# Patient Record
Sex: Female | Born: 1975 | Race: White | Hispanic: No | Marital: Single | Smoking: Never smoker
Health system: Southern US, Community
[De-identification: ages and names within clinical notes are randomized; demographics above are authoritative.]

## PROBLEM LIST (undated history)

## (undated) DIAGNOSIS — N390 Urinary tract infection, site not specified: Secondary | ICD-10-CM

## (undated) DIAGNOSIS — N83209 Unspecified ovarian cyst, unspecified side: Secondary | ICD-10-CM

## (undated) HISTORY — PX: CHOLECYSTECTOMY: SHX55

---

## 2016-11-10 DIAGNOSIS — L2089 Other atopic dermatitis: Secondary | ICD-10-CM | POA: Diagnosis not present

## 2016-11-19 ENCOUNTER — Emergency Department (HOSPITAL_COMMUNITY)
Admission: EM | Admit: 2016-11-19 | Discharge: 2016-11-19 | Disposition: A | Discharge status: Home / Self Care | Payer: Medicaid Other | Attending: Emergency Medicine | Admitting: Emergency Medicine

## 2016-11-19 ENCOUNTER — Encounter (HOSPITAL_COMMUNITY): Payer: Self-pay | Admitting: Emergency Medicine

## 2016-11-19 DIAGNOSIS — R21 Rash and other nonspecific skin eruption: Secondary | ICD-10-CM | POA: Diagnosis present

## 2016-11-19 DIAGNOSIS — L237 Allergic contact dermatitis due to plants, except food: Secondary | ICD-10-CM | POA: Diagnosis not present

## 2016-11-19 MED ORDER — PREDNISONE 20 MG PO TABS: ORAL_TABLET | ORAL | 0 refills | Status: DC

## 2016-11-19 MED ORDER — TETANUS-DIPHTH-ACELL PERTUSSIS 5-2.5-18.5 LF-MCG/0.5 IM SUSP
0.5000 mL | Freq: Once | INTRAMUSCULAR | Status: AC
  Administered 2016-11-19: 0.5 mL via INTRAMUSCULAR
  Filled 2016-11-19: qty 0.5

## 2016-11-19 NOTE — Discharge Instructions (Signed)
You can take Benadryl 50 mg every 6 hours as needed for itch. Take the prednisone as prescribed. Call Dr. Terri PiedraLupton to schedule appointment if not improving in 4 or 5 days. You can also call the number on these discharge instructions to get a primary care physician

## 2016-11-19 NOTE — ED Triage Notes (Signed)
Pt to ER for rash present to bilateral lower extremities, left forearm, right forearm, left flank. States was seen by PCP and given prednisone for poison ivy but has taken the course of steroids without any relief. States itches, burns, and is painful. States began as blisters.

## 2016-11-19 NOTE — ED Provider Notes (Addendum)
[facility] Provider Note   CSN: [id number] Arrival date & time: 11/19/16  0754     History   Chief Complaint Chief Complaint  Patient presents with  . Rash    HPI Pamela Hawkins is a 41 y.o. female.Complains of pruritic rash on arms, legs and lower abdomen onset 3 weeks ago. She noticed that after she walked out towards. She denies any shortness of breath denies fever denies feeling ill. She [place] physician [place] some small town" on her way to [place] from [place] who prescribed prednisone for one week which she has completed and she took one dose of Benadryl approximately 2 weeks ago, without relief. She's also used some sort of topical "salve" which she does not recall, without relief. No other associated symptoms nothing makes symptoms better or worse. She reports completing the prednisone "2 or 3 days ago."  HPI  History reviewed. No pertinent past medical history.  There are no active problems to display for this patient.   History reviewed. No pertinent surgical history.  OB History    No data available       Home Medications    Prior to Admission medications   Medication Sig Start Date End Date Taking? Authorizing Provider  predniSONE (DELTASONE) 20 MG tablet 2 tablets daily for 7 days 11/19/16   Doug Sou, MD    Family History History reviewed. No pertinent family history.  Social History Social History  Substance Use Topics  . Smoking status: Never Smoker  . Smokeless tobacco: Never Used  . Alcohol use No     Allergies   Patient has no allergy information on record.   Review of Systems Review of Systems  Constitutional: Negative.   HENT: Negative.   Respiratory: Negative.   Cardiovascular: Negative.   Gastrointestinal: Negative.   Musculoskeletal: Negative.   Skin: Positive for rash.  Allergic/Immunologic:       Not current on tetanus immunization  Neurological: Negative.   Psychiatric/Behavioral: Negative.   All other  systems reviewed and are negative.    Physical Exam Updated Vital Signs BP (!) 128/92 (BP Location: Left Arm)   Pulse 96   Temp 97.9 F (36.6 C) (Oral)   Resp 18   LMP 10/31/2016 (Exact Date)   SpO2 97%   Physical Exam  Constitutional: She appears well-developed and well-nourished. No distress.  HENT:  Head: Normocephalic and atraumatic.  Poor dentition no mucosal lesion  Eyes: Pupils are equal, round, and reactive to light. Conjunctivae are normal.  Neck: Neck supple. No tracheal deviation present. No thyromegaly present.  Cardiovascular: Normal rate and regular rhythm.   No murmur heard. Pulmonary/Chest: Effort normal and breath sounds normal.  Abdominal: Soft. Bowel sounds are normal. She exhibits no distension. There is no tenderness.  Musculoskeletal: Normal range of motion. She exhibits no edema or tenderness.  Neurological: She is alert. Coordination normal.  Skin: Skin is warm and dry. Rash noted.  Pinkish rash in streaks with a few tiny scabbed lesions predominantly on arms and legs slightly on lower abdomen, not on back or face. Not involving palms or soles.  Psychiatric: She has a normal mood and affect.  Nursing note and vitals reviewed.    ED Treatments / Results  Labs (all labs ordered are listed, but only abnormal results are displayed) Labs Reviewed - No data to display  EKG  EKG Interpretation None       Radiology No results found.  Procedures Procedures (including critical care time)  Medications Ordered  in ED Medications  Tdap (BOOSTRIX) injection 0.5 mL (not administered)     Initial Impression / Assessment and Plan / ED Course  I have reviewed the triage vital signs and the nursing notes.  Pertinent labs & imaging results that were available during my care of the patient were reviewed by me and considered in my medical decision making (see chart for details).     Rashes consistent with poison ivy or poison oak. Plan she get  prescription for 1 week of prednisone, Benadryl. Referral to dermatology Dr. Terri PiedraLupton. Referral primary care. Tdap updated. Recommend Aveeno baths  Final Clinical Impressions(s) / ED Diagnoses   Final diagnoses:  Allergic contact dermatitis due to plants, except food    New Prescriptions New Prescriptions   PREDNISONE (DELTASONE) 20 MG TABLET    2 tablets daily for 7 days     [name], [name], MD 11/19/16 [id number]    [name], [name], MD 11/19/16 1750

## 2017-01-04 ENCOUNTER — Encounter (HOSPITAL_COMMUNITY): Payer: Self-pay | Admitting: Emergency Medicine

## 2017-01-04 ENCOUNTER — Ambulatory Visit (INDEPENDENT_AMBULATORY_CARE_PROVIDER_SITE_OTHER): Payer: Medicaid Other

## 2017-01-04 ENCOUNTER — Ambulatory Visit (HOSPITAL_COMMUNITY)
Admission: EM | Admit: 2017-01-04 | Discharge: 2017-01-04 | Disposition: A | Discharge status: Home / Self Care | Payer: Medicaid Other | Attending: Family Medicine | Admitting: Family Medicine

## 2017-01-04 DIAGNOSIS — K5901 Slow transit constipation: Secondary | ICD-10-CM | POA: Diagnosis not present

## 2017-01-04 DIAGNOSIS — Z880 Allergy status to penicillin: Secondary | ICD-10-CM | POA: Diagnosis not present

## 2017-01-04 DIAGNOSIS — R350 Frequency of micturition: Secondary | ICD-10-CM

## 2017-01-04 DIAGNOSIS — R300 Dysuria: Secondary | ICD-10-CM

## 2017-01-04 DIAGNOSIS — Z3202 Encounter for pregnancy test, result negative: Secondary | ICD-10-CM | POA: Diagnosis not present

## 2017-01-04 DIAGNOSIS — N83209 Unspecified ovarian cyst, unspecified side: Secondary | ICD-10-CM | POA: Insufficient documentation

## 2017-01-04 DIAGNOSIS — R141 Gas pain: Secondary | ICD-10-CM

## 2017-01-04 DIAGNOSIS — Z9049 Acquired absence of other specified parts of digestive tract: Secondary | ICD-10-CM | POA: Diagnosis not present

## 2017-01-04 DIAGNOSIS — Z87440 Personal history of urinary (tract) infections: Secondary | ICD-10-CM | POA: Insufficient documentation

## 2017-01-04 DIAGNOSIS — R1030 Lower abdominal pain, unspecified: Secondary | ICD-10-CM | POA: Diagnosis present

## 2017-01-04 DIAGNOSIS — N390 Urinary tract infection, site not specified: Secondary | ICD-10-CM | POA: Diagnosis present

## 2017-01-04 HISTORY — DX: Unspecified ovarian cyst, unspecified side: N83.209

## 2017-01-04 HISTORY — DX: Urinary tract infection, site not specified: N39.0

## 2017-01-04 LAB — POCT URINALYSIS DIP (DEVICE)
BILIRUBIN URINE: NEGATIVE
Glucose, UA: NEGATIVE mg/dL
HGB URINE DIPSTICK: NEGATIVE
KETONES UR: NEGATIVE mg/dL
Leukocytes, UA: NEGATIVE
NITRITE: NEGATIVE
PROTEIN: NEGATIVE mg/dL
Specific Gravity, Urine: 1.02 (ref 1.005–1.030)
Urobilinogen, UA: 0.2 mg/dL (ref 0.0–1.0)
pH: 5 (ref 5.0–8.0)

## 2017-01-04 LAB — POCT PREGNANCY, URINE: Preg Test, Ur: NEGATIVE

## 2017-01-04 MED ORDER — CIPROFLOXACIN HCL 500 MG PO TABS: 500.0000 mg | ORAL_TABLET | Freq: Two times a day (BID) | ORAL | 0 refills | Status: DC

## 2017-01-04 NOTE — ED Triage Notes (Signed)
History of uti.  Last uti was 3 months ago.  Patient says current symptoms started 3 days ago.  Low abdominal pain, back pain, burning with urination and frequent urination

## 2017-01-04 NOTE — Discharge Instructions (Signed)
Drink plenty of water and stay well-hydrated. Take the MiraLAX as directed, start out with 3 glasses over. When our two-hour and a half. If results are not sufficient and 6 hours repeat two glasses. Take the antibiotic as directed. It is uncertain as to whether he may have an early urinary tract infection, the urine is clear today. For worsening, new symptoms or problems, vomiting, fever then you may need to go to the emergency department.

## 2017-01-04 NOTE — ED Provider Notes (Signed)
[facility]    CSN: [id number] Arrival date & time: 01/04/17  1026     History   Chief Complaint Chief Complaint  Patient presents with  . Urinary Tract Infection    HPI Pamela Hawkins is a 41 y.o. female.   41 year old female complaining of suprapubic pain for 3 days. She states she feels sick in the stomach but not nausea or vomiting. Also has occasional dysuria, urinary frequency primarily at night. States that she goes about 6 times a night. And only a 2 times more a day.. When she voids she reports large amounts of urine but occasionally just dribbles. Denies increase in thirst. She points to the suprapubic area as the source of pain until she is supine then she states she has had discomfort across the lower abdomen. States has regular daily bowel movements. No diarrhea.      Past Medical History:  Diagnosis Date  . Ovarian cyst   . UTI (urinary tract infection)     There are no active problems to display for this patient.   Past Surgical History:  Procedure Laterality Date  . CHOLECYSTECTOMY      OB History    No data available       Home Medications    Prior to Admission medications   Medication Sig Start Date End Date Taking? Authorizing Provider  acetaminophen (TYLENOL) 325 MG tablet Take 650 mg by mouth every 6 (six) hours as needed.   Yes [provider]  ibuprofen (ADVIL,MOTRIN) 200 MG tablet Take 200 mg by mouth every 6 (six) hours as needed.   Yes [provider]  ciprofloxacin (CIPRO) 500 MG tablet Take 1 tablet (500 mg total) by mouth 2 (two) times daily. 01/04/17   Hayden Rasmussen, NP    Family History No family history on file.  Social History Social History  Substance Use Topics  . Smoking status: Never Smoker  . Smokeless tobacco: Never Used  . Alcohol use No     Allergies   Codeine and Penicillins   Review of Systems Review of Systems  Constitutional: Negative.   HENT: Negative.   Respiratory:  Negative.   Cardiovascular: Negative for chest pain and palpitations.  Gastrointestinal: Positive for abdominal pain. Negative for abdominal distention, constipation, diarrhea, nausea and vomiting.  Genitourinary: Positive for dysuria and frequency. Negative for pelvic pain, vaginal bleeding and vaginal discharge.       Mid Suprapubic pain  Musculoskeletal: Negative.   Neurological: Negative.   All other systems reviewed and are negative.    Physical Exam Triage Vital Signs ED Triage Vitals  Enc Vitals Group     BP 01/04/17 1147 109/71     Pulse Rate 01/04/17 1147 76     Resp 01/04/17 1147 18     Temp 01/04/17 1147 97.7 F (36.5 C)     Temp Source 01/04/17 1147 Oral     SpO2 01/04/17 1147 98 %     Weight --      Height --      Head Circumference --      Peak Flow --      Pain Score 01/04/17 1144 5     Pain Loc --      Pain Edu? --      Excl. in GC? --    No data found.   Updated Vital Signs BP 109/71 (BP Location: Right Arm) Comment (BP Location): large cuff  Pulse 76   Temp 97.7 F (36.5 C) (Oral)  Resp 18   LMP 12/22/2016 (Exact Date)   SpO2 98%   Visual Acuity Right Eye Distance:   Left Eye Distance:   Bilateral Distance:    Right Eye Near:   Left Eye Near:    Bilateral Near:     Physical Exam  Constitutional: She is oriented to person, place, and time. She appears well-developed and well-nourished.  Neck: Neck supple.  Cardiovascular: Normal rate, regular rhythm and normal heart sounds.   Pulmonary/Chest: Effort normal.  Abdominal: Soft. Bowel sounds are normal. There is tenderness. There is no rebound.  Generalized abdominal tenderness, left lower quadrant with the greatest amount of tenderness. Abdomen is soft. Some guarding with deep palpation in the lower quadrants. Tenderness to the mid suprapubic area.  Musculoskeletal: Normal range of motion. She exhibits no edema.  Neurological: She is alert and oriented to person, place, and time.  Skin: Skin  is warm and dry.  Psychiatric: She has a normal mood and affect.  Nursing note and vitals reviewed.    UC Treatments / Results  Labs (all labs ordered are listed, but only abnormal results are displayed) Labs Reviewed  URINE CULTURE  POCT URINALYSIS DIP (DEVICE)  POCT PREGNANCY, URINE    EKG  EKG Interpretation None       Radiology Dg Abd 1 View  Result Date: 01/04/2017 CLINICAL DATA:  Bladder pain EXAM: ABDOMEN - 1 VIEW COMPARISON:  None. FINDINGS: No gaseous small bowel dilatation. Moderate stool volume noted right colon. Surgical clips right upper quadrant suggest prior cholecystectomy. Phleboliths are seen over the lower anatomic pelvis bilaterally. Visualized bony anatomy is unremarkable. IMPRESSION: Nonspecific bowel gas pattern. Electronically Signed   By: Kennith Center M.D.   On: 01/04/2017 12:34    Procedures Procedures (including critical care time)  Medications Ordered in UC Medications - No data to display   Initial Impression / Assessment and Plan / UC Course  I have reviewed the triage vital signs and the nursing notes.  Pertinent labs & imaging results that were available during my care of the patient were reviewed by me and considered in my medical decision making (see chart for details).    Drink plenty of water and stay well-hydrated. Take the MiraLAX as directed, start out with 3 glasses over. When our two-hour and a half. If results are not sufficient and 6 hours repeat two glasses. Take the antibiotic as directed. It is uncertain as to whether he may have an early urinary tract infection, the urine is clear today. For worsening, new symptoms or problems, vomiting, fever then you may need to go to the emergency department.     Final Clinical Impressions(s) / UC Diagnoses   Final diagnoses:  Abdominal gas pain  Slow transit constipation  Dysuria  Urinary frequency    New Prescriptions New Prescriptions   CIPROFLOXACIN (CIPRO) 500 MG TABLET     Take 1 tablet (500 mg total) by mouth 2 (two) times daily.     Controlled Substance Prescriptions [place] Controlled Substance Registry consulted? Not Applicable   Hayden Rasmussen, NP 01/04/17 1401

## 2017-01-06 LAB — URINE CULTURE: SPECIAL REQUESTS: NORMAL

## 2017-05-22 DIAGNOSIS — R109 Unspecified abdominal pain: Secondary | ICD-10-CM | POA: Diagnosis not present

## 2017-08-16 ENCOUNTER — Ambulatory Visit (HOSPITAL_COMMUNITY)
Admission: EM | Admit: 2017-08-16 | Discharge: 2017-08-16 | Disposition: A | Discharge status: Home / Self Care | Payer: Medicaid Other | Attending: Family Medicine | Admitting: Family Medicine

## 2017-08-16 ENCOUNTER — Encounter (HOSPITAL_COMMUNITY): Payer: Self-pay | Admitting: Emergency Medicine

## 2017-08-16 ENCOUNTER — Ambulatory Visit (INDEPENDENT_AMBULATORY_CARE_PROVIDER_SITE_OTHER): Payer: Medicaid Other

## 2017-08-16 DIAGNOSIS — R05 Cough: Secondary | ICD-10-CM

## 2017-08-16 DIAGNOSIS — R062 Wheezing: Secondary | ICD-10-CM

## 2017-08-16 DIAGNOSIS — R059 Cough, unspecified: Secondary | ICD-10-CM

## 2017-08-16 MED ORDER — AZITHROMYCIN 250 MG PO TABS: 250.0000 mg | ORAL_TABLET | Freq: Every day | ORAL | 0 refills | Status: DC

## 2017-08-16 MED ORDER — PREDNISONE 10 MG (21) PO TBPK: ORAL_TABLET | Freq: Every day | ORAL | 0 refills | Status: DC

## 2017-08-16 MED ORDER — ALBUTEROL SULFATE HFA 108 (90 BASE) MCG/ACT IN AERS: 2.0000 | INHALATION_SPRAY | RESPIRATORY_TRACT | 2 refills | Status: DC | PRN

## 2017-08-16 NOTE — ED Triage Notes (Signed)
Pt sts cough and URI sx with pain with cough; pt sts out of her inhaler

## 2017-08-23 NOTE — ED Provider Notes (Signed)
[facility]   [id number] 08/16/17 Arrival Time: 1725  ASSESSMENT & PLAN:  1. Cough   2. Wheezing     Meds ordered this encounter  Medications  . predniSONE (STERAPRED UNI-PAK 21 TAB) 10 MG (21) TBPK tablet    Sig: Take by mouth daily. Take as directed.    Dispense:  21 tablet    Refill:  0  . azithromycin (ZITHROMAX) 250 MG tablet    Sig: Take 1 tablet (250 mg total) by mouth daily. Take first 2 tablets together, then 1 every day until finished.    Dispense:  6 tablet    Refill:  0  . albuterol (PROVENTIL HFA;VENTOLIN HFA) 108 (90 Base) MCG/ACT inhaler    Sig: Inhale 2 puffs into the lungs every 4 (four) hours as needed for wheezing or shortness of breath.    Dispense:  1 Inhaler    Refill:  2   Discussed typical duration of symptoms. OTC symptom care as needed. Ensure adequate fluid intake and rest. May f/u with PCP or here as needed.  Reviewed expectations re: course of current medical issues. Questions answered. Outlined signs and symptoms indicating need for more acute intervention. Patient verbalized understanding. After Visit Summary given.   SUBJECTIVE: History from: patient.  Pamela Hawkins is a 42 y.o. female who presents with complaint of nasal congestion, post-nasal drainage, and a persistent dry cough. Onset abrupt, approximately 1 week ago, maybe longer. Overall fatigued with body aches. SOB: none. Wheezing: moderate, esp with coughing. Out of her albuterol inhaler. No specific aggravating or alleviating factors reported. Fever: no. Overall normal PO intake without n/v. Sick contacts: no. OTC treatment: cold medications without relief.   Social History   Tobacco Use  Smoking Status Never Smoker  Smokeless Tobacco Never Used    ROS: As per HPI.   OBJECTIVE:  Vitals:   08/16/17 1747  BP: 130/87  Pulse: (!) 105  Resp: 18  Temp: 99.2 F (37.3 C)  TempSrc: Oral  SpO2: 95%    Slight tachycardia noted.  General appearance: alert;  appears fatigued HEENT: nasal congestion; clear runny nose; throat irritation secondary to post-nasal drainage Neck: supple without LAD CV: regular Lungs: unlabored respirations, symmetrical air entry with expiratory wheezes; cough: mild; no respiratory distress Skin: warm and dry Ext: no edema Psychological: alert and cooperative; normal mood and affect  Imaging: Dg Chest 2 View  Result Date: 08/16/2017 CLINICAL DATA:  Cough and fever. EXAM: CHEST - 2 VIEW COMPARISON:  None. FINDINGS: The heart size and mediastinal contours are within normal limits. Both lungs are clear. The visualized skeletal structures are unremarkable. IMPRESSION: No active cardiopulmonary disease. Electronically Signed   By: Deatra Robinson M.D.   On: 08/16/2017 18:31    Allergies  Allergen Reactions  . Codeine   . Penicillins     Past Medical History:  Diagnosis Date  . Ovarian cyst   . UTI (urinary tract infection)    History reviewed. No pertinent family history. Social History   Socioeconomic History  . Marital status: Single    Spouse name: Not on file  . Number of children: Not on file  . Years of education: Not on file  . Highest education level: Not on file  Occupational History  . Not on file  Social Needs  . Financial resource strain: Not on file  . Food insecurity:    Worry: Not on file    Inability: Not on file  . Transportation needs:    Medical:  Not on file    Non-medical: Not on file  Tobacco Use  . Smoking status: Never Smoker  . Smokeless tobacco: Never Used  Substance and Sexual Activity  . Alcohol use: No  . Drug use: Not on file  . Sexual activity: Never  Lifestyle  . Physical activity:    Days per week: Not on file    Minutes per session: Not on file  . Stress: Not on file  Relationships  . Social connections:    Talks on phone: Not on file    Gets together: Not on file    Attends religious service: Not on file    Active member of club or organization: Not on file      Attends meetings of clubs or organizations: Not on file    Relationship status: Not on file  . Intimate partner violence:    Fear of current or ex partner: Not on file    Emotionally abused: Not on file    Physically abused: Not on file    Forced sexual activity: Not on file  Other Topics Concern  . Not on file  Social History Narrative  . Not on file           Mardella Layman, MD 08/23/17 [phone]

## 2017-10-06 DIAGNOSIS — H5213 Myopia, bilateral: Secondary | ICD-10-CM | POA: Diagnosis not present

## 2017-10-09 DIAGNOSIS — H5213 Myopia, bilateral: Secondary | ICD-10-CM | POA: Diagnosis not present

## 2017-11-09 DIAGNOSIS — H524 Presbyopia: Secondary | ICD-10-CM | POA: Diagnosis not present

## 2017-12-18 ENCOUNTER — Ambulatory Visit (HOSPITAL_COMMUNITY)
Admission: EM | Admit: 2017-12-18 | Discharge: 2017-12-18 | Disposition: A | Discharge status: Home / Self Care | Payer: Medicaid Other | Attending: Family Medicine | Admitting: Family Medicine

## 2017-12-18 ENCOUNTER — Encounter (HOSPITAL_COMMUNITY): Payer: Self-pay | Admitting: Emergency Medicine

## 2017-12-18 ENCOUNTER — Other Ambulatory Visit: Payer: Self-pay

## 2017-12-18 DIAGNOSIS — T148XXA Other injury of unspecified body region, initial encounter: Secondary | ICD-10-CM | POA: Diagnosis not present

## 2017-12-18 DIAGNOSIS — M79604 Pain in right leg: Secondary | ICD-10-CM

## 2017-12-18 DIAGNOSIS — M25511 Pain in right shoulder: Secondary | ICD-10-CM

## 2017-12-18 DIAGNOSIS — S39012A Strain of muscle, fascia and tendon of lower back, initial encounter: Secondary | ICD-10-CM

## 2017-12-18 MED ORDER — IBUPROFEN 800 MG PO TABS: 800.0000 mg | ORAL_TABLET | Freq: Three times a day (TID) | ORAL | 0 refills | Status: DC | PRN

## 2017-12-18 MED ORDER — KETOROLAC TROMETHAMINE 60 MG/2ML IM SOLN
INTRAMUSCULAR | Status: AC
  Filled 2017-12-18: qty 2

## 2017-12-18 MED ORDER — CYCLOBENZAPRINE HCL 5 MG PO TABS: 5.0000 mg | ORAL_TABLET | Freq: Three times a day (TID) | ORAL | 0 refills | Status: DC | PRN

## 2017-12-18 MED ORDER — KETOROLAC TROMETHAMINE 60 MG/2ML IM SOLN
60.0000 mg | Freq: Once | INTRAMUSCULAR | Status: AC
  Administered 2017-12-18: 60 mg via INTRAMUSCULAR

## 2017-12-18 NOTE — ED Provider Notes (Signed)
[facility]    CSN: [id number] Arrival date & time: 12/18/17  1846     History   Chief Complaint Chief Complaint  Patient presents with  . Back Pain    HPI Pamela Hawkins is a 42 y.o. female.   HPI  Patient is here for pain.  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  She states she was trying to transfer a patient yesterday when the daughter distracted them and the patient fell towards her right.  She was standing with both hands wrapped under the patient's arms and was pivoting to the right, when the patient fell towards her right.  She the patient, at her own expense.  She immediately felt pain.  It was worse today.  She called her employer.  She is having pain in her right shoulder, right low back, and entire right leg.  She states she does not have any orthopedic problems or back problems normally.  She states she did call her employer they told her to go ahead and get medical care if she felt like she needed it.  She took some Tylenol for pain.  Past Medical History:  Diagnosis Date  . Ovarian cyst   . UTI (urinary tract infection)     There are no active problems to display for this patient.   Past Surgical History:  Procedure Laterality Date  . CHOLECYSTECTOMY      OB History   None      Home Medications    Prior to Admission medications   Medication Sig Start Date End Date Taking? Authorizing Provider  acetaminophen (TYLENOL) 325 MG tablet Take 650 mg by mouth every 6 (six) hours as needed.   Yes [provider]  albuterol (PROVENTIL HFA;VENTOLIN HFA) 108 (90 Base) MCG/ACT inhaler Inhale 2 puffs into the lungs every 4 (four) hours as needed for wheezing or shortness of breath. 08/16/17   Mardella Layman, MD  cyclobenzaprine (FLEXERIL) 5 MG tablet Take 1 tablet (5 mg total) by mouth 3 (three) times daily as needed for muscle spasms. 12/18/17   Eustace Moore, MD  ibuprofen (ADVIL,MOTRIN) 800 MG tablet Take 1 tablet (800 mg total) by mouth  every 8 (eight) hours as needed for moderate pain. 12/18/17   Eustace Moore, MD    Family History Family History  Problem Relation Age of Onset  . Diabetes Mother   . COPD Mother   . Diabetes Father     Social History Social History   Tobacco Use  . Smoking status: Never Smoker  . Smokeless tobacco: Never Used  Substance Use Topics  . Alcohol use: No  . Drug use: Never     Allergies   Codeine and Penicillins   Review of Systems Review of Systems  Constitutional: Negative for chills and fever.  HENT: Negative for ear pain and sore throat.   Eyes: Negative for pain and visual disturbance.  Respiratory: Negative for cough and shortness of breath.   Cardiovascular: Negative for chest pain and palpitations.  Gastrointestinal: Negative for abdominal pain and vomiting.  Genitourinary: Negative for dysuria and hematuria.  Musculoskeletal: Positive for arthralgias, back pain and myalgias.  Skin: Negative for color change and rash.  Neurological: Negative for seizures and syncope.  Psychiatric/Behavioral: Positive for sleep disturbance.  All other systems reviewed and are negative.    Physical Exam Triage Vital Signs ED Triage Vitals  Enc Vitals Group     BP 12/18/17 2002 132/87  Pulse Rate 12/18/17 2002 91     Resp 12/18/17 2002 20     Temp 12/18/17 2002 97.9 F (36.6 C)     Temp Source 12/18/17 2002 Oral     SpO2 12/18/17 2002 100 %     Weight --      Height --      Head Circumference --      Peak Flow --      Pain Score 12/18/17 1959 6     Pain Loc --      Pain Edu? --      Excl. in GC? --    No data found.  Updated Vital Signs BP 132/87 (BP Location: Right Arm)   Pulse 91   Temp 97.9 F (36.6 C) (Oral)   Resp 20   LMP 12/11/2017   SpO2 100%   Visual Acuity Right Eye Distance:   Left Eye Distance:   Bilateral Distance:    Right Eye Near:   Left Eye Near:    Bilateral Near:     Physical Exam  Constitutional: She appears well-developed  and well-nourished. No distress.  Very demonstrative.  Facial grimacing.  Keeps eyes shut.  Moans and rocks back and forth.  Acts as though she is in severe pain.  HENT:  Head: Normocephalic and atraumatic.  Mouth/Throat: Oropharynx is clear and moist.  Eyes: Pupils are equal, round, and reactive to light. Conjunctivae are normal.  Neck: Normal range of motion. Neck supple.  Cardiovascular: Normal rate, regular rhythm and normal heart sounds.  Pulmonary/Chest: Effort normal and breath sounds normal. No respiratory distress.  Abdominal: Soft. She exhibits no distension.  Musculoskeletal: Normal range of motion. She exhibits no edema.       Back:       Arms:      Legs: Neurological: She is alert.  Skin: Skin is warm and dry.  She gets on and off the exam table without any apparent discomfort   UC Treatments / Results  Labs (all labs ordered are listed, but only abnormal results are displayed) Labs Reviewed - No data to display  EKG None  Radiology No results found.  Procedures Procedures (including critical care time)  Medications Ordered in UC Medications  ketorolac (TORADOL) injection 60 mg (60 mg Intramuscular Given 12/18/17 2043)    Initial Impression / Assessment and Plan / UC Course  I have reviewed the triage vital signs and the nursing notes.  Pertinent labs & imaging results that were available during my care of the patient were reviewed by me and considered in my medical decision making (see chart for details).    With a work-related injury.  Symptoms outweigh physical exam findings.  Evidence of symptom magnification. Final Clinical Impressions(s) / UC Diagnoses   Final diagnoses:  Muscle strain  Strain of lumbar region, initial encounter  Right leg pain  Acute pain of right shoulder     Discharge Instructions     Activity as tolerated. No bending or lifting. Take ibuprofen 3 times a day with food.  This is an anti-inflammatory pain medication Take  cyclobenzaprine as needed, muscle relaxer.  You may take this 3 times a day.  Caution drowsiness We will follow-up with the [facility] Ice or heat to painful muscles   ED Prescriptions    Medication Sig Dispense Auth. Provider   ibuprofen (ADVIL,MOTRIN) 800 MG tablet Take 1 tablet (800 mg total) by mouth every 8 (eight) hours as needed for moderate pain.  90 tablet Eustace Moore, MD   cyclobenzaprine (FLEXERIL) 5 MG tablet Take 1 tablet (5 mg total) by mouth 3 (three) times daily as needed for muscle spasms. 30 tablet Eustace Moore, MD     Controlled Substance Prescriptions [place] Controlled Substance Registry consulted? Not Applicable   Eustace Moore, MD 12/18/17 2124

## 2017-12-18 NOTE — ED Triage Notes (Signed)
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  Onset yesterday

## 2017-12-18 NOTE — Discharge Instructions (Signed)
Activity as tolerated. No bending or lifting. Take ibuprofen 3 times a day with food.  This is an anti-inflammatory pain medication Take cyclobenzaprine as needed, muscle relaxer.  You may take this 3 times a day.  Caution drowsiness We will follow-up with the [facility] Ice or heat to painful muscles

## 2017-12-21 ENCOUNTER — Encounter (HOSPITAL_COMMUNITY): Payer: Self-pay | Admitting: Emergency Medicine

## 2017-12-21 ENCOUNTER — Emergency Department (HOSPITAL_COMMUNITY)
Admission: EM | Admit: 2017-12-21 | Discharge: 2017-12-21 | Discharge status: Against Medical Advice | Payer: Medicaid Other | Attending: Emergency Medicine | Admitting: Emergency Medicine

## 2017-12-21 DIAGNOSIS — M25511 Pain in right shoulder: Secondary | ICD-10-CM | POA: Diagnosis not present

## 2017-12-21 DIAGNOSIS — Z5321 Procedure and treatment not carried out due to patient leaving prior to being seen by health care provider: Secondary | ICD-10-CM | POA: Insufficient documentation

## 2017-12-21 NOTE — ED Triage Notes (Signed)
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

## 2017-12-25 ENCOUNTER — Other Ambulatory Visit: Payer: Self-pay

## 2017-12-25 ENCOUNTER — Emergency Department (HOSPITAL_COMMUNITY): Payer: Medicaid Other

## 2017-12-25 ENCOUNTER — Emergency Department (HOSPITAL_COMMUNITY)
Admission: EM | Admit: 2017-12-25 | Discharge: 2017-12-25 | Disposition: A | Discharge status: Home / Self Care | Payer: Medicaid Other | Attending: Emergency Medicine | Admitting: Emergency Medicine

## 2017-12-25 ENCOUNTER — Encounter (HOSPITAL_COMMUNITY): Payer: Self-pay | Admitting: *Deleted

## 2017-12-25 DIAGNOSIS — W19XXXA Unspecified fall, initial encounter: Secondary | ICD-10-CM

## 2017-12-25 DIAGNOSIS — M79661 Pain in right lower leg: Secondary | ICD-10-CM | POA: Insufficient documentation

## 2017-12-25 DIAGNOSIS — M546 Pain in thoracic spine: Secondary | ICD-10-CM | POA: Diagnosis not present

## 2017-12-25 DIAGNOSIS — M545 Low back pain, unspecified: Secondary | ICD-10-CM

## 2017-12-25 DIAGNOSIS — M25539 Pain in unspecified wrist: Secondary | ICD-10-CM | POA: Insufficient documentation

## 2017-12-25 DIAGNOSIS — M25561 Pain in right knee: Secondary | ICD-10-CM | POA: Insufficient documentation

## 2017-12-25 DIAGNOSIS — Z79899 Other long term (current) drug therapy: Secondary | ICD-10-CM | POA: Insufficient documentation

## 2017-12-25 DIAGNOSIS — W03XXXD Other fall on same level due to collision with another person, subsequent encounter: Secondary | ICD-10-CM | POA: Insufficient documentation

## 2017-12-25 DIAGNOSIS — M7918 Myalgia, other site: Secondary | ICD-10-CM

## 2017-12-25 DIAGNOSIS — M25511 Pain in right shoulder: Secondary | ICD-10-CM | POA: Insufficient documentation

## 2017-12-25 MED ORDER — KETOROLAC TROMETHAMINE 30 MG/ML IJ SOLN
30.0000 mg | Freq: Once | INTRAMUSCULAR | Status: AC
  Administered 2017-12-25: 30 mg via INTRAVENOUS
  Filled 2017-12-25: qty 1

## 2017-12-25 MED ORDER — METHOCARBAMOL 500 MG PO TABS
1000.0000 mg | ORAL_TABLET | Freq: Once | ORAL | Status: AC
  Administered 2017-12-25: 1000 mg via ORAL
  Filled 2017-12-25: qty 2

## 2017-12-25 MED ORDER — NAPROXEN 500 MG PO TABS: 500.0000 mg | ORAL_TABLET | Freq: Two times a day (BID) | ORAL | 0 refills | Status: DC

## 2017-12-25 MED ORDER — HYDROCODONE-ACETAMINOPHEN 5-325 MG PO TABS
1.0000 | ORAL_TABLET | Freq: Once | ORAL | Status: AC
  Administered 2017-12-25: 1 via ORAL
  Filled 2017-12-25: qty 1

## 2017-12-25 MED ORDER — LIDOCAINE 5 % EX PTCH
2.0000 | MEDICATED_PATCH | CUTANEOUS | Status: DC
  Administered 2017-12-25: 2 via TRANSDERMAL
  Filled 2017-12-25: qty 2

## 2017-12-25 MED ORDER — METHOCARBAMOL 500 MG PO TABS: 500.0000 mg | ORAL_TABLET | Freq: Two times a day (BID) | ORAL | 0 refills | Status: DC

## 2017-12-25 NOTE — Discharge Instructions (Addendum)
X-ray show no evidence of fracture.  Please take Naprosyn twice daily, do not combine with any other over-the-counter pain relievers aside from Tylenol.  You may use Robaxin as needed to help with muscle spasm and pain, this can cause drowsiness do not take before driving.  Also use over-the-counter salon pas cane patches or Biofreeze gel, and alternate ice and heat.  If symptoms are not improving the phone number provided on this paperwork to follow-up with her primary care doctor.

## 2017-12-25 NOTE — ED Triage Notes (Signed)
Pt in after fall at work, was seen at urgent care for same when it happened, c/o pain to her right shoulder, wrist and hand, also lower back

## 2017-12-25 NOTE — ED Notes (Signed)
Pt verbalized understanding of d/c instructions and has no further questions, VSS, NAD.  

## 2017-12-25 NOTE — ED Provider Notes (Signed)
[facility] EMERGENCY DEPARTMENT Provider Note   CSN: [id number] Arrival date & time: 12/25/17  1136     History   Chief Complaint Chief Complaint  Patient presents with  . Fall    HPI Pamela Hawkins is a 42 y.o. female.  Pamela Hawkins is a 42 y.o. Female with a history of UTI, ovarian cyst and cholecystectomy, who presents to the emergency department for evaluation of a work injury which occurred on 9/9.  She was initially seen at urgent care immediately after the injury and pain was thought to be related to muscle spasm and soreness.  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  Since then she has had pain over the right upper thoracic back, right lower back, right shoulder, right wrist and right knee as well as some pain in the right calf.  No x-rays initially done, but patient was treated with NSAIDs and muscle relaxers.  She has not done any ice or heat.  She reports that her pain has persisted and she has been unable to return to work.  She does not have a primary care doctor in this area to follow-up with.     Past Medical History:  Diagnosis Date  . Ovarian cyst   . UTI (urinary tract infection)     There are no active problems to display for this patient.   Past Surgical History:  Procedure Laterality Date  . CHOLECYSTECTOMY       OB History   None      Home Medications    Prior to Admission medications   Medication Sig Start Date End Date Taking? Authorizing Provider  cyclobenzaprine (FLEXERIL) 5 MG tablet Take 1 tablet (5 mg total) by mouth 3 (three) times daily as needed for muscle spasms. 12/18/17  Yes Eustace Moore, MD  ibuprofen (ADVIL,MOTRIN) 800 MG tablet Take 1 tablet (800 mg total) by mouth every 8 (eight) hours as needed for moderate pain. 12/18/17  Yes  Eustace Moore, MD  albuterol (PROVENTIL HFA;VENTOLIN HFA) 108 (90 Base) MCG/ACT inhaler Inhale 2 puffs into the lungs every 4 (four) hours as needed for wheezing or shortness of breath. Patient not taking: Reported on 12/25/2017 08/16/17   Mardella Layman, MD  methocarbamol (ROBAXIN) 500 MG tablet Take 1 tablet (500 mg total) by mouth 2 (two) times daily. 12/25/17   Dartha Lodge, PA-C  naproxen (NAPROSYN) 500 MG tablet Take 1 tablet (500 mg total) by mouth 2 (two) times daily. 12/25/17   Dartha Lodge, PA-C    Family History Family History  Problem Relation Age of Onset  . Diabetes Mother   . COPD Mother   . Diabetes Father     Social History Social History   Tobacco Use  . Smoking status: Never Smoker  . Smokeless tobacco: Never Used  Substance Use Topics  . Alcohol use: No  . Drug use: Never     Allergies   Codeine and Penicillins   Review of Systems Review of Systems  Constitutional: Negative for chills and fever.  HENT: Negative.   Eyes: Negative for visual disturbance.  Respiratory: Negative for cough and shortness of breath.   Cardiovascular: Negative for chest pain.  Gastrointestinal: Negative for abdominal pain, nausea and vomiting.  Genitourinary: Negative for dysuria.  Musculoskeletal: Positive for arthralgias, back  pain, myalgias and neck pain. Negative for joint swelling.  Skin: Negative for color change, rash and wound.  Neurological: Negative for weakness and numbness.     Physical Exam Updated Vital Signs BP (!) 133/92 (BP Location: Right Arm)   Pulse (!) 108   Temp 98 F (36.7 C) (Oral)   Resp 20   LMP 12/11/2017   SpO2 100%   Physical Exam  Constitutional: She is oriented to person, place, and time. She appears well-developed and well-nourished. No distress.  HENT:  Head: Normocephalic and atraumatic.  Eyes: Right eye exhibits no discharge. Left eye exhibits no discharge.  Neck: Normal range of motion. Neck supple.  Tenderness to  palpation over trapezius muscle, no midline C-spine tenderness  Cardiovascular: Normal rate, regular rhythm, normal heart sounds and intact distal pulses.  Pulmonary/Chest: Effort normal and breath sounds normal. No respiratory distress. She exhibits no tenderness.  Respirations equal and unlabored, patient able to speak in full sentences, lungs clear to auscultation bilaterally, chest nontender to palpation  Abdominal: Soft. Bowel sounds are normal. She exhibits no distension and no mass. There is no tenderness. There is no guarding.  Abdomen soft, nondistended, nontender to palpation in all quadrants without guarding or peritoneal signs  Musculoskeletal:  Tenderness to palpation over the right shoulder and trapezius muscles without palpable deformity or swelling.  There is also tenderness over the right wrist, no palpable bony deformity.  Range of motion is intact in all joints in the right upper extremity with minimal discomfort.  2+ radial pulse, 5/5 strength and sensation is intact Tenderness of the anterior aspect of the right knee without appreciable swelling or deformity, no overlying erythema or warmth, full extension and flexion with some discomfort.  Minimal tenderness over the calf without swelling or palpable cord.  2+ DP and PT pulses, sensation intact, normal strength  Neurological: She is alert and oriented to person, place, and time. Coordination normal.  Speech is clear, able to follow commands CN III-XII intact Normal strength in upper and lower extremities bilaterally including dorsiflexion and plantar flexion, strong and equal grip strength Sensation normal to light and sharp touch Moves extremities without ataxia, coordination intact  Skin: Skin is warm and dry. Capillary refill takes less than 2 seconds. She is not diaphoretic.  Psychiatric: She has a normal mood and affect. Her behavior is normal.  Nursing note and vitals reviewed.    ED Treatments / Results  Labs (all  labs ordered are listed, but only abnormal results are displayed) Labs Reviewed - No data to display  EKG None  Radiology Dg Lumbar Spine Complete  Result Date: 12/25/2017 CLINICAL DATA:  Fall 1 week ago with lower back pain, initial encounter EXAM: LUMBAR SPINE - COMPLETE 4+ VIEW COMPARISON:  None. FINDINGS: Five lumbar type vertebral bodies are well visualized. Vertebral body height is well maintained. Pars defects are noted. Mild osteophytic changes are seen. No anterolisthesis is noted. No soft tissue changes are noted. IMPRESSION: Mild degenerative change without acute abnormality. Electronically Signed   By: Alcide CleverMark  Lukens M.D.   On: 12/25/2017 12:39   Dg Shoulder Right  Result Date: 12/25/2017 CLINICAL DATA:  Fall 1 week ago with persistent right shoulder pain, initial encounter EXAM: RIGHT SHOULDER - 2+ VIEW COMPARISON:  None. FINDINGS: Degenerative changes of the acromioclavicular joint are noted. No acute fracture or dislocation is seen. The underlying bony thorax is within normal limits. No soft tissue changes are noted. IMPRESSION: No acute abnormality noted. Electronically Signed  By: Alcide Clever M.D.   On: 12/25/2017 12:38   Dg Wrist Complete Right  Result Date: 12/25/2017 CLINICAL DATA:  Fall 1 week ago with persistent right wrist pain, initial encounter EXAM: RIGHT WRIST - COMPLETE 3+ VIEW COMPARISON:  None. FINDINGS: There is no evidence of fracture or dislocation. There is no evidence of arthropathy or other focal bone abnormality. Soft tissues are unremarkable. IMPRESSION: No acute abnormality noted. Electronically Signed   By: Alcide Clever M.D.   On: 12/25/2017 12:38   Dg Knee Complete 4 Views Right  Result Date: 12/25/2017 CLINICAL DATA:  Fall 1 week ago with persistent right knee pain, initial encounter EXAM: RIGHT KNEE - COMPLETE 4+ VIEW COMPARISON:  None. FINDINGS: Mild medial joint space narrowing is noted. No acute fracture or dislocation is noted. No soft tissue  changes are seen. IMPRESSION: No acute abnormality noted. Electronically Signed   By: Alcide Clever M.D.   On: 12/25/2017 12:40   Dg Foot Complete Right  Result Date: 12/25/2017 CLINICAL DATA:  Fall 1 week ago with persistent right foot pain, initial encounter EXAM: RIGHT FOOT COMPLETE - 3+ VIEW COMPARISON:  None. FINDINGS: Os naviculare is noted. No acute fracture or dislocation is seen. No gross soft tissue abnormality is noted. IMPRESSION: No acute abnormality noted. Electronically Signed   By: Alcide Clever M.D.   On: 12/25/2017 12:42    Procedures Procedures (including critical care time)  Medications Ordered in ED Medications  lidocaine (LIDODERM) 5 % 2 patch (2 patches Transdermal Patch Applied 12/25/17 1534)  methocarbamol (ROBAXIN) tablet 1,000 mg (1,000 mg Oral Given 12/25/17 1534)  ketorolac (TORADOL) 30 MG/ML injection 30 mg (30 mg Intravenous Given 12/25/17 1534)  HYDROcodone-acetaminophen (NORCO/VICODIN) 5-325 MG per tablet 1 tablet (1 tablet Oral Given 12/25/17 1534)     Initial Impression / Assessment and Plan / ED Course  I have reviewed the triage vital signs and the nursing notes.  Pertinent labs & imaging results that were available during my care of the patient were reviewed by me and considered in my medical decision making (see chart for details).  Patient presents for evaluation of pain over the right side of the body after a work-related injury when the patient fell on her during transfer.  Injuries occurred on 9/9.  Initially seen at urgent care but no imaging done at that time.  Patient is having upper thoracic and lumbar back pain, right shoulder and wrist pain and right knee pain as well as some pain over the right calf.  There is some mild midline lumbar tenderness, tenderness primarily over the right trapezius on exam, there is no chest or abdominal tenderness and there are good lungs sounds throughout.  No obvious deformity at the knee wrist or shoulder, no overlying  swelling.  Ambulatory.  X-rays of the lumbar spine, right shoulder, right wrist, right knee and right foot ordered and show no evidence of fracture, there is some mild degenerative changes within the lumbar spine.  I suspect this is all soft tissue injury and muscle spasm.  Pain significantly improved here in the ED with Toradol, 1 dose of Norco, Robaxin and lidocaine patches.  I feel patient is stable for discharge home with NSAIDs, Robaxin and for the counter lidocaine patches.  Discussed ice and heat as well and outpatient follow-up with PCP if symptoms are still not improving.  Patient expresses understanding and is in agreement with this plan.  Return precautions discussed.  Patient for discharge home at this time.  Final Clinical Impressions(s) / ED Diagnoses   Final diagnoses:  Fall, initial encounter  Musculoskeletal pain  Acute right-sided low back pain without sciatica  Acute right-sided thoracic back pain    ED Discharge Orders         Ordered    naproxen (NAPROSYN) 500 MG tablet  2 times daily     12/25/17 1620    methocarbamol (ROBAXIN) 500 MG tablet  2 times daily     12/25/17 1620           Jodi Geralds [place], [place] 12/26/17 0138    Margarita Grizzle, MD 12/26/17 [phone]

## 2018-04-20 ENCOUNTER — Encounter (HOSPITAL_COMMUNITY): Payer: Self-pay

## 2018-04-20 ENCOUNTER — Ambulatory Visit (HOSPITAL_COMMUNITY)
Admission: EM | Admit: 2018-04-20 | Discharge: 2018-04-20 | Disposition: A | Discharge status: Home / Self Care | Payer: Medicaid Other | Attending: Family Medicine | Admitting: Family Medicine

## 2018-04-20 DIAGNOSIS — R1030 Lower abdominal pain, unspecified: Secondary | ICD-10-CM

## 2018-04-20 DIAGNOSIS — R350 Frequency of micturition: Secondary | ICD-10-CM | POA: Insufficient documentation

## 2018-04-20 DIAGNOSIS — R102 Pelvic and perineal pain: Secondary | ICD-10-CM | POA: Insufficient documentation

## 2018-04-20 LAB — POCT URINALYSIS DIP (DEVICE)
Bilirubin Urine: NEGATIVE
Glucose, UA: NEGATIVE mg/dL
HGB URINE DIPSTICK: NEGATIVE
Ketones, ur: NEGATIVE mg/dL
LEUKOCYTES UA: NEGATIVE
NITRITE: NEGATIVE
PH: 6 (ref 5.0–8.0)
PROTEIN: NEGATIVE mg/dL
Specific Gravity, Urine: >=1.03 (ref 1.005–1.030)
UROBILINOGEN UA: 1 mg/dL (ref 0.0–1.0)

## 2018-04-20 MED ORDER — ONDANSETRON 4 MG PO TBDP
4.0000 mg | ORAL_TABLET | Freq: Once | ORAL | Status: AC
  Administered 2018-04-20: 4 mg via ORAL

## 2018-04-20 MED ORDER — ONDANSETRON 4 MG PO TBDP: 4.0000 mg | ORAL_TABLET | Freq: Three times a day (TID) | ORAL | 0 refills | Status: DC | PRN

## 2018-04-20 MED ORDER — ONDANSETRON 4 MG PO TBDP
ORAL_TABLET | ORAL | Status: AC
  Filled 2018-04-20: qty 1

## 2018-04-20 MED ORDER — PHENAZOPYRIDINE HCL 200 MG PO TABS: 200.0000 mg | ORAL_TABLET | Freq: Three times a day (TID) | ORAL | 0 refills | Status: DC

## 2018-04-20 NOTE — ED Provider Notes (Signed)
[facility]    CSN: [id number] Arrival date & time: 04/20/18  1818     History   Chief Complaint Chief Complaint  Patient presents with  . Urinary Tract Infection    HPI Pamela Hawkins is a 43 y.o. female.   43 year old female with history of ovarian cysts comes in for few day history of suprapubic pain, urinary frequency, nausea. States suprapubic pain is intermittent, worse with urination. Denies hematuria. Nausea without vomiting. Denies fever, chills, night sweats. Normal vaginal discharge without itching, bleeding, spotting. Not sexually active. LMP 03/30/2018. Had diarrhea about 1 week ago, but now has had normal BMs without constipation or diarrhea, last BM this morning.      Past Medical History:  Diagnosis Date  . Ovarian cyst   . UTI (urinary tract infection)     There are no active problems to display for this patient.   Past Surgical History:  Procedure Laterality Date  . CHOLECYSTECTOMY      OB History   No obstetric history on file.      Home Medications    Prior to Admission medications   Medication Sig Start Date End Date Taking? Authorizing Provider  albuterol (PROVENTIL HFA;VENTOLIN HFA) 108 (90 Base) MCG/ACT inhaler Inhale 2 puffs into the lungs every 4 (four) hours as needed for wheezing or shortness of breath. Patient not taking: Reported on 12/25/2017 08/16/17   Mardella LaymanHagler, Brian, MD  cyclobenzaprine (FLEXERIL) 5 MG tablet Take 1 tablet (5 mg total) by mouth 3 (three) times daily as needed for muscle spasms. 12/18/17   Eustace MooreNelson, Yvonne Sue, MD  ibuprofen (ADVIL,MOTRIN) 800 MG tablet Take 1 tablet (800 mg total) by mouth every 8 (eight) hours as needed for moderate pain. 12/18/17   Eustace MooreNelson, Yvonne Sue, MD  methocarbamol (ROBAXIN) 500 MG tablet Take 1 tablet (500 mg total) by mouth 2 (two) times daily. 12/25/17   Dartha LodgeFord, Kelsey N, PA-C  naproxen (NAPROSYN) 500 MG tablet Take 1 tablet (500 mg total) by mouth 2 (two) times daily. 12/25/17   Dartha LodgeFord,  Kelsey N, PA-C  ondansetron (ZOFRAN ODT) 4 MG disintegrating tablet Take 1 tablet (4 mg total) by mouth every 8 (eight) hours as needed for nausea or vomiting. 04/20/18   Cathie HoopsYu, Leylany Nored V, PA-C  phenazopyridine (PYRIDIUM) 200 MG tablet Take 1 tablet (200 mg total) by mouth 3 (three) times daily. 04/20/18   Belinda FisherYu, Urban Naval V, PA-C    Family History Family History  Problem Relation Age of Onset  . Diabetes Mother   . COPD Mother   . Diabetes Father     Social History Social History   Tobacco Use  . Smoking status: Never Smoker  . Smokeless tobacco: Never Used  Substance Use Topics  . Alcohol use: No  . Drug use: Never     Allergies   Codeine and Penicillins   Review of Systems Review of Systems  Reason unable to perform ROS: See HPI as above.     Physical Exam Triage Vital Signs ED Triage Vitals  Enc Vitals Group     BP 04/20/18 1908 120/82     Pulse Rate 04/20/18 1908 94     Resp 04/20/18 1908 20     Temp 04/20/18 1908 98.8 F (37.1 C)     Temp Source 04/20/18 1908 Oral     SpO2 04/20/18 1908 100 %     Weight --      Height --      Head Circumference --  Peak Flow --      Pain Score 04/20/18 1909 6     Pain Loc --      Pain Edu? --      Excl. in GC? --    No data found.  Updated Vital Signs BP 120/82 (BP Location: Right Arm)   Pulse 94   Temp 98.8 F (37.1 C) (Oral)   Resp 20   LMP 03/30/2018   SpO2 100%   Physical Exam Constitutional:      General: She is not in acute distress.    Appearance: She is well-developed. She is not ill-appearing, toxic-appearing or diaphoretic.  HENT:     Head: Normocephalic and atraumatic.  Eyes:     Conjunctiva/sclera: Conjunctivae normal.     Pupils: Pupils are equal, round, and reactive to light.  Cardiovascular:     Rate and Rhythm: Normal rate and regular rhythm.     Heart sounds: Normal heart sounds. No murmur. No friction rub. No gallop.   Pulmonary:     Effort: Pulmonary effort is normal.     Breath sounds: Normal  breath sounds. No wheezing or rales.  Abdominal:     General: Bowel sounds are normal.     Palpations: Abdomen is soft. There is no mass.     Tenderness: There is no right CVA tenderness or left CVA tenderness.     Comments: Tenderness to palpation of suprapubic region and LLQ. No guarding or rebound.   Skin:    General: Skin is warm and dry.  Neurological:     Mental Status: She is alert and oriented to person, place, and time.  Psychiatric:        Behavior: Behavior normal.        Judgment: Judgment normal.      UC Treatments / Results  Labs (all labs ordered are listed, but only abnormal results are displayed) Labs Reviewed  POCT URINALYSIS DIP (DEVICE)    EKG None  Radiology No results found.  Procedures Procedures (including critical care time)  Medications Ordered in UC Medications  ondansetron (ZOFRAN-ODT) disintegrating tablet 4 mg (has no administration in time range)    Initial Impression / Assessment and Plan / UC Course  I have reviewed the triage vital signs and the nursing notes.  Pertinent labs & imaging results that were available during my care of the patient were reviewed by me and considered in my medical decision making (see chart for details).    Will provide pyridium for painful urination. zofran for nausea/vomiting. Push fluids. Return precautions given. Patient expresses understanding and agrees to plan.  Final Clinical Impressions(s) / UC Diagnoses   Final diagnoses:  Urinary frequency  Suprapubic pain    ED Prescriptions    Medication Sig Dispense Auth. Provider   phenazopyridine (PYRIDIUM) 200 MG tablet Take 1 tablet (200 mg total) by mouth 3 (three) times daily. 6 tablet Yolinda Duerr V, PA-C   ondansetron (ZOFRAN ODT) 4 MG disintegrating tablet Take 1 tablet (4 mg total) by mouth every 8 (eight) hours as needed for nausea or vomiting. 20 tablet Threasa Alpha, [place] 04/20/18 1954

## 2018-04-20 NOTE — Discharge Instructions (Addendum)
Your urine was negative for infection.  Start Pyridium for painful urination, this will turn your urine orange.  Zofran as needed for nausea, vomiting. Keep hydrated, urine should be clear to pale yellow in color.  If experiencing worsening symptoms, worsening pain, nausea/vomiting not controlled by medication, fever, unwilling to jump up and down due to pain, go to emergency department for further evaluation.  Otherwise follow-up with PCP/GYN for further evaluation and management needed.

## 2018-04-20 NOTE — ED Triage Notes (Signed)
Pt presents with pain/discomfort while urinating, lower pelvic pain, urinary frequency and nausea the past few days.

## 2018-04-21 ENCOUNTER — Emergency Department (HOSPITAL_COMMUNITY): Payer: Medicaid Other

## 2018-04-21 ENCOUNTER — Emergency Department (HOSPITAL_COMMUNITY)
Admission: EM | Admit: 2018-04-21 | Discharge: 2018-04-21 | Disposition: A | Discharge status: Home / Self Care | Payer: Medicaid Other | Attending: Emergency Medicine | Admitting: Emergency Medicine

## 2018-04-21 ENCOUNTER — Other Ambulatory Visit: Payer: Self-pay

## 2018-04-21 ENCOUNTER — Encounter (HOSPITAL_COMMUNITY): Payer: Self-pay

## 2018-04-21 DIAGNOSIS — S3993XA Unspecified injury of pelvis, initial encounter: Secondary | ICD-10-CM | POA: Diagnosis not present

## 2018-04-21 DIAGNOSIS — M25562 Pain in left knee: Secondary | ICD-10-CM | POA: Diagnosis not present

## 2018-04-21 DIAGNOSIS — S8992XA Unspecified injury of left lower leg, initial encounter: Secondary | ICD-10-CM | POA: Diagnosis not present

## 2018-04-21 DIAGNOSIS — Y939 Activity, unspecified: Secondary | ICD-10-CM | POA: Insufficient documentation

## 2018-04-21 DIAGNOSIS — R0789 Other chest pain: Secondary | ICD-10-CM | POA: Diagnosis not present

## 2018-04-21 DIAGNOSIS — S299XXA Unspecified injury of thorax, initial encounter: Secondary | ICD-10-CM | POA: Diagnosis not present

## 2018-04-21 DIAGNOSIS — S42018A Nondisplaced fracture of sternal end of left clavicle, initial encounter for closed fracture: Secondary | ICD-10-CM

## 2018-04-21 DIAGNOSIS — S199XXA Unspecified injury of neck, initial encounter: Secondary | ICD-10-CM | POA: Diagnosis not present

## 2018-04-21 DIAGNOSIS — Z79899 Other long term (current) drug therapy: Secondary | ICD-10-CM | POA: Diagnosis not present

## 2018-04-21 DIAGNOSIS — S79921A Unspecified injury of right thigh, initial encounter: Secondary | ICD-10-CM | POA: Diagnosis not present

## 2018-04-21 DIAGNOSIS — R0689 Other abnormalities of breathing: Secondary | ICD-10-CM | POA: Diagnosis not present

## 2018-04-21 DIAGNOSIS — S3991XA Unspecified injury of abdomen, initial encounter: Secondary | ICD-10-CM | POA: Diagnosis not present

## 2018-04-21 DIAGNOSIS — Y999 Unspecified external cause status: Secondary | ICD-10-CM | POA: Insufficient documentation

## 2018-04-21 DIAGNOSIS — S0990XA Unspecified injury of head, initial encounter: Secondary | ICD-10-CM | POA: Diagnosis not present

## 2018-04-21 DIAGNOSIS — R52 Pain, unspecified: Secondary | ICD-10-CM | POA: Diagnosis not present

## 2018-04-21 DIAGNOSIS — Y929 Unspecified place or not applicable: Secondary | ICD-10-CM | POA: Insufficient documentation

## 2018-04-21 DIAGNOSIS — M542 Cervicalgia: Secondary | ICD-10-CM | POA: Diagnosis not present

## 2018-04-21 DIAGNOSIS — R51 Headache: Secondary | ICD-10-CM | POA: Insufficient documentation

## 2018-04-21 DIAGNOSIS — S8991XA Unspecified injury of right lower leg, initial encounter: Secondary | ICD-10-CM | POA: Diagnosis not present

## 2018-04-21 DIAGNOSIS — R000 Tachycardia, unspecified: Secondary | ICD-10-CM | POA: Diagnosis not present

## 2018-04-21 DIAGNOSIS — M25561 Pain in right knee: Secondary | ICD-10-CM | POA: Diagnosis not present

## 2018-04-21 DIAGNOSIS — S42022A Displaced fracture of shaft of left clavicle, initial encounter for closed fracture: Secondary | ICD-10-CM | POA: Diagnosis not present

## 2018-04-21 LAB — CBC
HCT: 39.5 % (ref 36.0–46.0)
Hemoglobin: 12.9 g/dL (ref 12.0–15.0)
MCH: 30.6 pg (ref 26.0–34.0)
MCHC: 32.7 g/dL (ref 30.0–36.0)
MCV: 93.8 fL (ref 80.0–100.0)
Platelets: 319 10*3/uL (ref 150–400)
RBC: 4.21 MIL/uL (ref 3.87–5.11)
RDW: 12.1 % (ref 11.5–15.5)
WBC: 12.9 10*3/uL — ABNORMAL HIGH (ref 4.0–10.5)
nRBC: 0 % (ref 0.0–0.2)

## 2018-04-21 LAB — PROTIME-INR
INR: 1.07
Prothrombin Time: 13.8 seconds (ref 11.4–15.2)

## 2018-04-21 LAB — I-STAT CHEM 8, ED
BUN: 14 mg/dL (ref 6–20)
CALCIUM ION: 1.11 mmol/L — AB (ref 1.15–1.40)
Chloride: 105 mmol/L (ref 98–111)
Creatinine, Ser: 0.4 mg/dL — ABNORMAL LOW (ref 0.44–1.00)
Glucose, Bld: 110 mg/dL — ABNORMAL HIGH (ref 70–99)
HCT: 39 % (ref 36.0–46.0)
Hemoglobin: 13.3 g/dL (ref 12.0–15.0)
Potassium: 3.8 mmol/L (ref 3.5–5.1)
Sodium: 137 mmol/L (ref 135–145)
TCO2: 23 mmol/L (ref 22–32)

## 2018-04-21 LAB — COMPREHENSIVE METABOLIC PANEL
ALT: 23 U/L (ref 0–44)
AST: 26 U/L (ref 15–41)
Albumin: 3.5 g/dL (ref 3.5–5.0)
Alkaline Phosphatase: 56 U/L (ref 38–126)
Anion gap: 10 (ref 5–15)
BUN: 13 mg/dL (ref 6–20)
CALCIUM: 8.7 mg/dL — AB (ref 8.9–10.3)
CO2: 21 mmol/L — AB (ref 22–32)
Chloride: 106 mmol/L (ref 98–111)
Creatinine, Ser: 0.72 mg/dL (ref 0.44–1.00)
GFR calc Af Amer: >60 mL/min (ref >60)
GFR calc non Af Amer: >60 mL/min (ref >60)
Glucose, Bld: 107 mg/dL — ABNORMAL HIGH (ref 70–99)
Potassium: 3.8 mmol/L (ref 3.5–5.1)
Sodium: 137 mmol/L (ref 135–145)
TOTAL PROTEIN: 6.5 g/dL (ref 6.5–8.1)
Total Bilirubin: 0.8 mg/dL (ref 0.3–1.2)

## 2018-04-21 LAB — URINALYSIS, ROUTINE W REFLEX MICROSCOPIC
Bilirubin Urine: NEGATIVE
Glucose, UA: NEGATIVE mg/dL
Hgb urine dipstick: NEGATIVE
Ketones, ur: NEGATIVE mg/dL
Nitrite: NEGATIVE
Protein, ur: NEGATIVE mg/dL
Specific Gravity, Urine: 1.023 (ref 1.005–1.030)
pH: 7 (ref 5.0–8.0)

## 2018-04-21 LAB — I-STAT CG4 LACTIC ACID, ED: Lactic Acid, Venous: 2.23 mmol/L (ref 0.5–1.9)

## 2018-04-21 LAB — SAMPLE TO BLOOD BANK

## 2018-04-21 LAB — CDS SEROLOGY

## 2018-04-21 LAB — ETHANOL

## 2018-04-21 MED ORDER — NAPROXEN 500 MG PO TABS: 500.0000 mg | ORAL_TABLET | Freq: Two times a day (BID) | ORAL | 0 refills | Status: DC

## 2018-04-21 MED ORDER — ONDANSETRON HCL 4 MG/2ML IJ SOLN
4.0000 mg | Freq: Once | INTRAMUSCULAR | Status: AC
  Administered 2018-04-21: 4 mg via INTRAVENOUS
  Filled 2018-04-21: qty 2

## 2018-04-21 MED ORDER — HYDROMORPHONE HCL 1 MG/ML IJ SOLN
1.0000 mg | Freq: Once | INTRAMUSCULAR | Status: AC
  Administered 2018-04-21: 1 mg via INTRAVENOUS
  Filled 2018-04-21: qty 1

## 2018-04-21 MED ORDER — IOHEXOL 300 MG/ML  SOLN
100.0000 mL | Freq: Once | INTRAMUSCULAR | Status: AC | PRN
  Administered 2018-04-21: 100 mL via INTRAVENOUS

## 2018-04-21 MED ORDER — HYDROMORPHONE HCL 4 MG PO TABS: 4.0000 mg | ORAL_TABLET | Freq: Four times a day (QID) | ORAL | 0 refills | Status: DC | PRN

## 2018-04-21 NOTE — Discharge Instructions (Signed)
Expected be very sore and stiff for the next several days.  Follow-up with orthopedics information provided above give him a call.  Take the Naprosyn as directed take the hydromorphone as needed for additional pain relief.  As we discussed CT head neck face chest abdomen and pelvis the only acute finding was the collarbone fracture on the left side.  Work note provided.
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
## 2018-04-21 NOTE — ED Notes (Signed)
I-stat lactic acid results of 2.23 reported to [facility], MD @ [phone] via phone

## 2018-04-21 NOTE — ED Notes (Signed)
Patient verbalizes understanding of discharge instructions. Opportunity for questioning and answers were provided. 

## 2018-05-02 DIAGNOSIS — S42011A Anterior displaced fracture of sternal end of right clavicle, initial encounter for closed fracture: Secondary | ICD-10-CM | POA: Diagnosis not present

## 2018-05-02 DIAGNOSIS — Z7689 Persons encountering health services in other specified circumstances: Secondary | ICD-10-CM | POA: Diagnosis not present

## 2018-05-17 ENCOUNTER — Telehealth: Payer: Self-pay | Admitting: Family Medicine

## 2018-05-17 NOTE — Telephone Encounter (Signed)
Left a message on patients VM to call us so that we could get her scheduled to be seen here in our office.

## 2018-05-23 DIAGNOSIS — S42011A Anterior displaced fracture of sternal end of right clavicle, initial encounter for closed fracture: Secondary | ICD-10-CM | POA: Diagnosis not present

## 2018-05-23 DIAGNOSIS — Z7689 Persons encountering health services in other specified circumstances: Secondary | ICD-10-CM | POA: Diagnosis not present

## 2018-05-24 ENCOUNTER — Ambulatory Visit: Payer: No Typology Code available for payment source | Admitting: Physical Therapy

## 2018-05-30 ENCOUNTER — Encounter: Payer: Self-pay | Admitting: Physical Therapy

## 2018-05-30 ENCOUNTER — Ambulatory Visit: Payer: Medicaid Other | Attending: Orthopaedic Surgery | Admitting: Physical Therapy

## 2018-05-30 ENCOUNTER — Other Ambulatory Visit: Payer: Self-pay

## 2018-05-30 DIAGNOSIS — S42022D Displaced fracture of shaft of left clavicle, subsequent encounter for fracture with routine healing: Secondary | ICD-10-CM | POA: Diagnosis not present

## 2018-05-30 DIAGNOSIS — H8112 Benign paroxysmal vertigo, left ear: Secondary | ICD-10-CM | POA: Insufficient documentation

## 2018-05-30 DIAGNOSIS — R29898 Other symptoms and signs involving the musculoskeletal system: Secondary | ICD-10-CM | POA: Diagnosis not present

## 2018-05-30 DIAGNOSIS — M79602 Pain in left arm: Secondary | ICD-10-CM | POA: Diagnosis not present
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
## 2018-06-07 ENCOUNTER — Ambulatory Visit: Payer: Medicaid Other | Admitting: Physical Therapy

## 2018-06-07 ENCOUNTER — Encounter: Payer: Self-pay | Admitting: Physical Therapy

## 2018-06-07 DIAGNOSIS — R29898 Other symptoms and signs involving the musculoskeletal system: Secondary | ICD-10-CM

## 2018-06-07 DIAGNOSIS — H8112 Benign paroxysmal vertigo, left ear: Secondary | ICD-10-CM

## 2018-06-07 DIAGNOSIS — M79602 Pain in left arm: Secondary | ICD-10-CM

## 2018-06-07 DIAGNOSIS — S42022D Displaced fracture of shaft of left clavicle, subsequent encounter for fracture with routine healing: Secondary | ICD-10-CM

## 2018-06-08 NOTE — Therapy (Signed)
[facility] [address] [place], [place], [address] Phone: [phone]   Fax:  [phone]  Physical Therapy Treatment  Patient Details  Name: Pamela Hawkins MRN: [id number] Date of Birth: 05/26/1975 Referring Provider (PT): Nicki Guadalajara, MD   Encounter Date: 06/07/2018  PT End of Session - 06/08/18 0713    Visit Number  2    Number of Visits  4    Authorization Type  Medicaid- waiting for auth    Authorization - Visit Number  2    Authorization - Number of Visits  4    PT Start Time  1015    PT Stop Time  1057    PT Time Calculation (min)  42 min    Activity Tolerance  Patient tolerated treatment well    Behavior During Therapy  [facility] for tasks assessed/performed       Past Medical History:  Diagnosis Date  . Ovarian cyst   . UTI (urinary tract infection)     Past Surgical History:  Procedure Laterality Date  . CHOLECYSTECTOMY      There were no vitals filed for this visit.  Subjective Assessment - 06/07/18 1020    Subjective  Patient has been doing her exercises. She reports the shoulder is still sore but it is getting better. She has been working on her exercises. They make her sore but they are getting better. She continues to have vestibular symptoms. She has had soem difficulty with the Eino Farber exercises 2nd to difficulty lying on her side.     Patient Stated Goals  get arm back to working. reduce dizziness    Currently in Pain?  Yes    Pain Score  4     Pain Location  Shoulder    Pain Orientation  Left    Pain Type  Acute pain    Pain Onset  More than a month ago    Aggravating Factors   walking, gripping, trying to move arm     Pain Relieving Factors  rest                       [facility] Adult PT Treatment/Exercise - 06/08/18 0001      Shoulder Exercises: Seated   Other Seated Exercises  scap retraction for resting posture      Shoulder Exercises: Standing   Other Standing  Exercises  pendulum forward back/ sided to side x15 each. Careful monitoring for vestibular symptoms       Manual Therapy   Manual Therapy  Passive ROM;Soft tissue mobilization;Joint mobilization    Joint Mobilization  Grde 1 and 2 inferior glides for pain and to increase flexion     Soft tissue mobilization  to upper trap     Passive ROM  into ER IR and flexion. Cuing for guarding       Vestibular Treatment/Exercise - 06/08/18 0001      Vestibular Treatment/Exercise   Vestibular Treatment Provided  Canalith Repositioning    Canalith Repositioning  Epley Manuever Left       EPLEY MANUEVER LEFT   Number of Reps   2    Overall Response   Improved Symptoms     RESPONSE DETAILS LEFT  Decreased intensity and duration on second rep            PT Education - 06/08/18 0713    Education Details  reviewed/demonstrated Ethel Rana exercises; reviewed current HEP; BPPV handout provided  Person(s) Educated  Patient    Methods  Explanation;Demonstration;Tactile cues;Verbal cues    Comprehension  Verbalized understanding;Returned demonstration;Tactile cues required;Verbal cues required       PT Short Term Goals - 06/08/18 0720      PT SHORT TERM GOAL #1   Title  Pt will report dizziness improved by ay least 75%.    Baseline  50% improvement per patient     Time  3    Period  Weeks    Status  On-going      PT SHORT TERM GOAL #2   Title  improve AROM by at least 10 deg in flexion and abduction    Baseline  see flowsheet    Time  3    Period  Weeks    Status  On-going      PT SHORT TERM GOAL #3   Title  pt will be able to make a full fist    Baseline  unable at eval    Time  3    Period  Weeks    Status  On-going        PT Long Term Goals - 05/30/18 1333      PT LONG TERM GOAL #1   Title  Pt will demonstrate negative Dix Hallpike testing to indicate resolution of BPPV and increase safety with mobility.     Baseline  Positive L Gilberto Betterix Hallpike and reported dizziness  with increased fall risk    Time  12   to accommodate for MCD auth periods   Period  Weeks    Status  New    Target Date  08/22/18      PT LONG TERM GOAL #2   Title  Pt will demonstrate functional AROM to achieve functional ranges to reach overhead for dressing and reaching to high cabinets    Baseline  unable at eval, see flowsheet for ROM measures    Time  12    Period  Weeks    Status  New    Target Date  08/22/18      PT LONG TERM GOAL #3   Title  Pt will be able to grip and maneuver steering wheel as well as turn head to drive safely    Baseline  unable to drive at eval    Time  12    Period  Weeks    Status  New    Target Date  08/22/18      PT LONG TERM GOAL #4   Title  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 She reported improved pain with garde 1 inferior glides. She was encoruaged to continue with her HEP at home.  She required cuing for proper technique with pendums. Patient also worked on elbow and wrist strengthening. Pt reports she has only been able to do Brandt-Daroff exercises once or twice and after demonstration today, she reported she wasn't doing them correctly. She bleieves her daughter can assist her going forward. I encouraged her to implement these at home as I beleive this should greatly improve her dizziness symptoms.     Clinical Presentation  Unstable    Clinical Decision Making  Low    Rehab Potential  Good    PT Treatment/Interventions   Neuromuscular re-education;Canalith Repostioning;ADLs/Self Care Home Management;Cryotherapy;Electrical Stimulation;Moist Heat;Iontophoresis 4mg /ml Dexamethasone;Therapeutic activities;Therapeutic exercise;Patient/family education;Manual techniques;Passive range of motion;Taping;Dry needling    PT Next Visit Plan  continue with Epplys if needed. Continue with PROM; continue with strengthening as tolerated     PT Home Exercise Plan  Francee Piccolo- Daroff maneuver, putty grip, scap retraction, pendulum    Consulted and Agree with Plan of Care  Patient       Patient will benefit from skilled therapeutic intervention in order to improve the following deficits and impairments:  Dizziness, Improper body mechanics, Pain, Postural dysfunction, Increased muscle spasms, Decreased activity tolerance, Decreased range of motion, Decreased strength, Impaired UE functional use, Impaired flexibility, Increased edema  Visit Diagnosis: BPPV (benign paroxysmal positional vertigo), left  Closed displaced fracture of shaft of left clavicle with routine healing, subsequent encounter  Pain in left arm  Weakness of left arm     Problem List There are no active problems to display for this patient.     Vestibular Treatment/Education Provided by: Percell Boston, PT, DPT 06/08/18 8:22 AM Phone: [phone] Fax: [phone]   Other treatment provided by: Lorayne Bender PT DPT  06/08/2018 09:09 AM   [facility] [address] [place], [place], [address] Phone: [phone]   Fax:  [phone]  Name: Pamela Hawkins MRN: [id number] Date of Birth: 01-Jun-1975

## 2018-06-13 ENCOUNTER — Ambulatory Visit: Payer: Medicaid Other | Attending: Orthopaedic Surgery | Admitting: Physical Therapy

## 2018-06-13 ENCOUNTER — Encounter: Payer: Self-pay | Admitting: Physical Therapy

## 2018-06-13 DIAGNOSIS — H8112 Benign paroxysmal vertigo, left ear: Secondary | ICD-10-CM

## 2018-06-13 DIAGNOSIS — S42022D Displaced fracture of shaft of left clavicle, subsequent encounter for fracture with routine healing: Secondary | ICD-10-CM | POA: Diagnosis not present

## 2018-06-13 DIAGNOSIS — M79602 Pain in left arm: Secondary | ICD-10-CM

## 2018-06-13 DIAGNOSIS — R29898 Other symptoms and signs involving the musculoskeletal system: Secondary | ICD-10-CM

## 2018-06-13 NOTE — Patient Instructions (Signed)
Issued from exercise drawer:  Supine cane exercises 1-2 x a day 5 to 10 X each  hold a few seconds  all issued

## 2018-06-13 NOTE — Therapy (Signed)
[facility] [address] [place], [place], [address] Phone: [phone]   Fax:  [phone]  Physical Therapy Treatment  Patient Details  Name: Janavia Rottman MRN: [id number] Date of Birth: 21-Jul-1975 Referring Provider (PT): Nicki Guadalajara, MD   Encounter Date: 06/13/2018  PT End of Session - 06/13/18 1455    Visit Number  3    Number of Visits  4    Authorization Type  Medicaid- waiting for auth    Authorization - Visit Number  3    Authorization - Number of Visits  4    PT Start Time  1417    PT Stop Time  1512    PT Time Calculation (min)  55 min    Activity Tolerance  Patient tolerated treatment well    Behavior During Therapy  [facility] for tasks assessed/performed       Past Medical History:  Diagnosis Date  . Ovarian cyst   . UTI (urinary tract infection)     Past Surgical History:  Procedure Laterality Date  . CHOLECYSTECTOMY      There were no vitals filed for this visit.  Subjective Assessment - 06/13/18 1315    Subjective   Dizziness is getting better,  I have been doing the exercises at home.    Hand gets swelling  some days  and sometimes it does not.     Currently in Pain?  Yes    Pain Score  3     Pain Location  Shoulder    Pain Orientation  Left    Pain Descriptors / Indicators  Stabbing;Aching;Throbbing;Tightness;Constant;Burning;Dull   stiff   Pain Type  Acute pain    Pain Radiating Towards  left hand int chest  neck on left    Pain Frequency  Constant    Aggravating Factors   moving head or arm,  moving hand a certain way.      Pain Relieving Factors  rest    Effect of Pain on Daily Activities  Needs help ADL,  dressing ( pulling pants up,  tie shoes  Etc) and bathing ( hair washing)         [facility] PT Assessment - 06/13/18 0001      PROM   Left Shoulder Flexion  95 Degrees                   [facility] Adult PT Treatment/Exercise - 06/13/18 0001      Elbow Exercises   Elbow Flexion   AROM;10 reps   sitting,  flexion  extension  supination/pronation     Shoulder Exercises: Supine   Other Supine Exercises  demo due to lack of time supine scoulder Cane exercises.      Shoulder Exercises: Seated   Retraction  10 reps;Both    Horizontal ABduction Limitations  scaption table slides   10 X   External Rotation  10 reps;AAROM    External Rotation Limitations  cane  also with table and leaning body forward    Internal Rotation  10 reps    Internal Rotation Limitations  cane    Flexion  10 reps;AAROM    Flexion Limitations  to  95 degrees,  done with hands clasped and with sitting cane      Shoulder Exercises: ROM/Strengthening   Other ROM/Strengthening Exercises  tabvle slides flexion  ER abduction   AROM Hand and wrist elbow,  HEP after instruction and practi     Hand Exercises for Cervical Radiculopathy  Gross Grasp  10    Pinch Grip  each finger curving tip to thumb    Finger ABduction  10    Other Hand Exercise for Cervical Radiculopathy  thumb abduction and circles,  wrist AROM 3  way 10 x  decreasedd ROM noted      Cryotherapy   Number Minutes Cryotherapy  10 Minutes    Cryotherapy Location  Shoulder    Type of Cryotherapy  --   cold pack     Manual Therapy   Soft tissue mobilization  light soft tissue shoulder,  neck and retro arm    able to close hand easier,  less pain   Passive ROM  into ER IR and flexion. Cuing for guarding              PT Education - 06/13/18 1455    Education Details  HEP    Person(s) Educated  Patient    Methods  Explanation;Demonstration;Tactile cues;Verbal cues;Handout    Comprehension  Returned demonstration;Verbalized understanding;Need further instruction       PT Short Term Goals - 06/08/18 0720      PT SHORT TERM GOAL #1   Title  Pt will report dizziness improved by ay least 75%.    Baseline  50% improvement per patient     Time  3    Period  Weeks    Status  On-going      PT SHORT TERM GOAL #2   Title   improve AROM by at least 10 deg in flexion and abduction    Baseline  see flowsheet    Time  3    Period  Weeks    Status  On-going      PT SHORT TERM GOAL #3   Title  pt will be able to make a full fist    Baseline  unable at eval    Time  3    Period  Weeks    Status  On-going        PT Long Term Goals - 05/30/18 1333      PT LONG TERM GOAL #1   Title  Pt will demonstrate negative Dix Hallpike testing to indicate resolution of BPPV and increase safety with mobility.     Baseline  Positive L Gilberto Better and reported dizziness with increased fall risk    Time  12   to accommodate for MCD auth periods   Period  Weeks    Status  New    Target Date  08/22/18      PT LONG TERM GOAL #2   Title  Pt will demonstrate functional AROM to achieve functional ranges to reach overhead for dressing and reaching to high cabinets    Baseline  unable at eval, see flowsheet for ROM measures    Time  12    Period  Weeks    Status  New    Target Date  08/22/18      PT LONG TERM GOAL #3   Title  Pt will be able to grip and maneuver steering wheel as well as turn head to drive safely    Baseline  unable to drive at eval    Time  12    Period  Weeks    Status  New    Target Date  08/22/18      PT LONG TERM GOAL #4   Title  Pt will be able to manuever patients for bed mobility in order to  return to work, pain <=3/10    Baseline  unable at eval    Time  12    Period  Weeks    Status  New    Target Date  08/22/18      PT LONG TERM GOAL #5   Title  pt will be able to complete all household and child care activities without limitation by UE pain    Baseline  unable at eval    Time  12    Period  Weeks    Status  New    Target Date  08/22/18            Plan - 06/13/18 1456    Clinical Impression Statement  Able to progress HEP.  Dizziness improving,  I was careful to avoid big position changes fore this reason.  Less pain at end of session.  P    PT Next Visit Plan  continue  with Epplys if needed. Continue with PROM; continue with strengthening as tolerated   review supine cane  consier pulleys??    PT Home Exercise Plan  Brandt- Daroff maneuver, putty grip, scap retraction, pendulum,  supine/ sitting cane AROM hand elbow.     Consulted and Agree with Plan of Care  Patient       Patient will benefit from skilled therapeutic intervention in order to improve the following deficits and impairments:     Visit Diagnosis: BPPV (benign paroxysmal positional vertigo), left  Closed displaced fracture of shaft of left clavicle with routine healing, subsequent encounter  Pain in left arm  Weakness of left arm     Problem List There are no active problems to display for this patient.   HARRIS,KAREN  PTA 06/13/2018, 2:58 PM  [facility] [address] [place], [place], [address] Phone: [phone]   Fax:  [phone]  Name: Kyriana Wirz MRN: [id number] Date of Birth: 1976-04-08

## 2018-06-15 ENCOUNTER — Ambulatory Visit: Payer: Medicaid Other | Admitting: Physical Therapy

## 2018-06-15 ENCOUNTER — Encounter: Payer: Self-pay | Admitting: Physical Therapy

## 2018-06-15 DIAGNOSIS — M79602 Pain in left arm: Secondary | ICD-10-CM | POA: Diagnosis not present

## 2018-06-15 DIAGNOSIS — S42022D Displaced fracture of shaft of left clavicle, subsequent encounter for fracture with routine healing: Secondary | ICD-10-CM | POA: Diagnosis not present

## 2018-06-15 DIAGNOSIS — H8112 Benign paroxysmal vertigo, left ear: Secondary | ICD-10-CM | POA: Diagnosis not present

## 2018-06-15 DIAGNOSIS — R29898 Other symptoms and signs involving the musculoskeletal system: Secondary | ICD-10-CM

## 2018-06-15 NOTE — Therapy (Signed)
[facility] Outpatient Rehabilitation [facility] [address] [place], [place], [address] Phone: [phone]   Fax:  [phone]  Physical Therapy Treatment/Re-evaluation  Patient Details  Name: Pamela Hawkins MRN: [id number] Date of Birth: Feb 19, 1976 Referring Provider (PT): Nicki Guadalajara, MD   Encounter Date: 06/15/2018  PT End of Session - 06/15/18 1152    Visit Number  4    Authorization Type  Medicaid- authorization request submitted 3/6    Authorization - Visit Number  4    Authorization - Number of Visits  4    PT Start Time  1100    PT Stop Time  1143    PT Time Calculation (min)  43 min    Activity Tolerance  Patient tolerated treatment well    Behavior During Therapy  [facility] for tasks assessed/performed       Past Medical History:  Diagnosis Date  . Ovarian cyst   . UTI (urinary tract infection)     Past Surgical History:  Procedure Laterality Date  . CHOLECYSTECTOMY      There were no vitals filed for this visit.  Subjective Assessment - 06/15/18 1105    Subjective  Dizziness is improved- I can look side to side without dizziness but does still feel dizzy when laying supine. Hand cont swelling but is better, sometimes can make a full fist. Still having pain right at clavicle. Can move a little bit more. Cont sharp pain in second ray of Left hand.     Patient Stated Goals  get arm back to working. reduce dizziness    Currently in Pain?  Yes    Pain Score  6     Pain Location  Shoulder   clavicle   Pain Orientation  Left;Anterior    Pain Descriptors / Indicators  Aching;Dull    Aggravating Factors   moving arm    Pain Relieving Factors  ice    Effect of Pain on Daily Activities  requires assistance for ADLs and self care         [facility] PT Assessment - 06/15/18 0001      Assessment   Medical Diagnosis  Lt clavicle fracture    Referring Provider (PT)  Nicki Guadalajara, MD    Onset Date/Surgical Date  04/21/18    Hand Dominance  Right    Next  MD Visit  4/1      Home Environment   Living Environment  Private residence    Living Arrangements  Children      Prior Function   Vocation Requirements  CNA, in school for medical assistant      Cognition   Overall Cognitive Status  Within Functional Limits for tasks assessed      Observation/Other Assessments-Edema    Edema  --   mild edema in Lt hand, no longer pitting     Sensation   Additional Comments  [facility]      Posture/Postural Control   Posture Comments  Lt shoulder rounded, guarded and holding close to body      AROM   Left Shoulder Extension  38 Degrees    Left Shoulder Flexion  94 Degrees    Left Shoulder ABduction  55 Degrees    Left Wrist Extension  30 Degrees    Left Wrist Flexion  35 Degrees      PROM   Left Shoulder Flexion  140 Degrees    Left Shoulder ABduction  100 Degrees      Palpation   Palpation comment  cont TTP at fracture site with sharp pain created with mobilization                   [facility] Adult PT Treatment/Exercise - 06/15/18 0001      Shoulder Exercises: Pulleys   Flexion  2 minutes    Scaption  2 minutes      Shoulder Exercises: Stretch   Other Shoulder Stretches  table slides- flexion, scaption, abduction, extension             PT Education - 06/15/18 1152    Education Details  goals, HEP, adhesive capsulitis s/s & possibility, healing of clavicle, possible OT for hand, changes in dizziness, ROM progress, POC    Person(s) Educated  Patient    Methods  Explanation    Comprehension  Verbalized understanding;Need further instruction       PT Short Term Goals - 06/15/18 1111      PT SHORT TERM GOAL #1   Title  Pt will report dizziness improved by ay least 75%.    Status  Achieved      PT SHORT TERM GOAL #2   Title  improve AROM by at least 10 deg in flexion and abduction    Baseline  see flowsheet      PT SHORT TERM GOAL #3   Title  pt will be able to make a full fist    Baseline  able- unable to create  force for carrying but can achieve position    Status  Achieved        PT Long Term Goals - 05/30/18 1333      PT LONG TERM GOAL #1   Title  Pt will demonstrate negative Dix Hallpike testing to indicate resolution of BPPV and increase safety with mobility.     Baseline  Positive L Gilberto Better and reported dizziness with increased fall risk    Time  12   to accommodate for MCD auth periods   Period  Weeks    Status  New    Target Date  08/22/18      PT LONG TERM GOAL #2   Title  Pt will demonstrate functional AROM to achieve functional ranges to reach overhead for dressing and reaching to high cabinets    Baseline  unable at eval, see flowsheet for ROM measures    Time  12    Period  Weeks    Status  New    Target Date  08/22/18      PT LONG TERM GOAL #3   Title  Pt will be able to grip and maneuver steering wheel as well as turn head to drive safely    Baseline  unable to drive at eval    Time  12    Period  Weeks    Status  New    Target Date  08/22/18      PT LONG TERM GOAL #4   Title  Pt will be able to manuever patients for bed mobility in order to return to work, pain <=3/10    Baseline  unable at eval    Time  12    Period  Weeks    Status  New    Target Date  08/22/18      PT LONG TERM GOAL #5   Title  pt will be able to complete all household and child care activities without limitation by UE pain    Baseline  unable at eval  Time  12    Period  Weeks    Status  New    Target Date  08/22/18            Plan - 06/15/18 1148    Clinical Impression Statement  Pt has made significant progress in ROM both actively and passively. I still significantly limited in available functional strength and will continue to benefit from skilled training to reach long term goals as progress is made. Dizziness has improved and will continue to be addressed. Requested that she avoid laying supine due to onset of dizziness. Stretching via table slides and pulleys made her  arm feel better.     PT Treatment/Interventions  Neuromuscular re-education;Canalith Repostioning;ADLs/Self Care Home Management;Cryotherapy;Electrical Stimulation;Moist Heat;Iontophoresis 4mg /ml Dexamethasone;Therapeutic activities;Therapeutic exercise;Patient/family education;Manual techniques;Passive range of motion;Taping;Dry needling    PT Next Visit Plan  continue with Epplys if needed. pulleys, ranger, isometrics 6 way    PT Home Exercise Plan  Brandt- Daroff maneuver, putty grip, scap retraction, pendulum,  supine/ sitting cane AROM hand elbow. pulleys/table slides    Consulted and Agree with Plan of Care  Patient       Patient will benefit from skilled therapeutic intervention in order to improve the following deficits and impairments:  Dizziness, Improper body mechanics, Pain, Postural dysfunction, Increased muscle spasms, Decreased activity tolerance, Decreased range of motion, Decreased strength, Impaired UE functional use, Impaired flexibility, Increased edema  Visit Diagnosis: BPPV (benign paroxysmal positional vertigo), left  Closed displaced fracture of shaft of left clavicle with routine healing, subsequent encounter  Pain in left arm  Weakness of left arm     Problem List There are no active problems to display for this patient.   Jyaire Koudelka C. Elide Stalzer PT, DPT 06/15/18 11:54 AM   [facility] Outpatient Rehabilitation [facility] [address] [place], [place], [address] Phone: [phone]   Fax:  [phone]  Name: Elesha Wydra MRN: [id number] Date of Birth: 17-Jan-1976

## 2018-06-26 ENCOUNTER — Other Ambulatory Visit: Payer: Self-pay

## 2018-06-26 ENCOUNTER — Encounter: Payer: Self-pay | Admitting: Physical Therapy

## 2018-06-26 ENCOUNTER — Ambulatory Visit: Payer: Medicaid Other | Admitting: Physical Therapy

## 2018-06-26 DIAGNOSIS — R29898 Other symptoms and signs involving the musculoskeletal system: Secondary | ICD-10-CM | POA: Diagnosis not present

## 2018-06-26 DIAGNOSIS — M79602 Pain in left arm: Secondary | ICD-10-CM

## 2018-06-26 DIAGNOSIS — S42022D Displaced fracture of shaft of left clavicle, subsequent encounter for fracture with routine healing: Secondary | ICD-10-CM | POA: Diagnosis not present

## 2018-06-26 DIAGNOSIS — H8112 Benign paroxysmal vertigo, left ear: Secondary | ICD-10-CM | POA: Diagnosis not present

## 2018-06-26 NOTE — Therapy (Addendum)
[facility] Outpatient Rehabilitation [facility] [address] [place], [place], [address] Phone: [phone]   Fax:  [phone]  Physical Therapy Treatment  Patient Details  Name: Pamela Hawkins MRN: [id number] Date of Birth: 15-Jul-1975 Referring Provider (PT): Nicki Guadalajara, MD   Encounter Date: 06/26/2018  PT End of Session - 06/26/18 1304    Visit Number  5    Number of Visits  16    Authorization Type  Medicaid- authorization request submitted 3/6 approved for 12 visits from 06/25/2018 to 08/05/2018    Authorization - Visit Number  5    Authorization - Number of Visits  16    PT Start Time  1230    PT Stop Time  1315    PT Time Calculation (min)  45 min    Activity Tolerance  Patient tolerated treatment well    Behavior During Therapy  [facility] for tasks assessed/performed       Past Medical History:  Diagnosis Date  . Ovarian cyst   . UTI (urinary tract infection)     Past Surgical History:  Procedure Laterality Date  . CHOLECYSTECTOMY      There were no vitals filed for this visit.  Subjective Assessment - 06/26/18 1233    Subjective  Pt reporting at the end of last week and reporting increased pain at home. Pt reporting while cooking she was having increased stabbing like pains. Pt reporting the next morning it was better the next moring.  Pt did report her dizziness has improved and the exercises she has been doing at home are helping.     Patient Stated Goals  get arm back to working. reduce dizziness    Currently in Pain?  Yes    Pain Score  1     Pain Location  Shoulder    Pain Orientation  Left    Pain Descriptors / Indicators  Aching    Pain Type  Acute pain    Pain Onset  More than a month ago                       [facility] Adult PT Treatment/Exercise - 06/27/18 0001      Manual Therapy   Manual Therapy  Soft tissue mobilization    Manual therapy comments  10 minutes    Soft tissue mobilization  light soft tissue to upper  trap, neck and shoulder               PT Short Term Goals - 06/26/18 1242      PT SHORT TERM GOAL #1   Title  Pt will report dizziness improved by ay least 75%.    Baseline  50% improvement per patient     Time  3    Period  Weeks    Status  Achieved      PT SHORT TERM GOAL #2   Title  improve AROM by at least 10 deg in flexion and abduction    Baseline  see flowsheet    Time  3    Period  Weeks    Status  On-going      PT SHORT TERM GOAL #3   Title  pt will be able to make a full fist    Baseline  able- unable to create force for carrying but can achieve position    Time  3    Period  Weeks    Status  Achieved  PT Long Term Goals - 06/26/18 1242      PT LONG TERM GOAL #1   Title  Pt will demonstrate negative Dix Hallpike testing to indicate resolution of BPPV and increase safety with mobility.     Baseline  Positive L Dix Hallpike and reported dizziness with increased fall risk    Time  12    Period  Weeks    Status  New      PT LONG TERM GOAL #2   Title  Pt will demonstrate functional AROM to achieve functional ranges to reach overhead for dressing and reaching to high cabinets    Baseline  unable at eval, see flowsheet for ROM measures    Time  12    Period  Weeks    Status  New      PT LONG TERM GOAL #3   Title  Pt will be able to grip and maneuver steering wheel as well as turn head to drive safely    Baseline  unable to drive at eval    Time  12    Period  Weeks    Status  New      PT LONG TERM GOAL #4   Title  Pt will be able to manuever patients for bed mobility in order to return to work, pain <=3/10    Baseline  unable at eval    Time  12    Period  Weeks    Status  New      PT LONG TERM GOAL #5   Title  pt will be able to complete all household and child care activities without limitation by UE pain    Baseline  unable at eval    Time  12    Period  Weeks            Plan - 06/26/18 1306    Clinical Impression Statement   Pt making great progress with ROM in her left shoulder. Pt reporting dizziness has improved with her Home exercises. Pt still progressing toward reaching her LTG's and PLOF with her L UE. Pt still presenting with weakness in her L grip strength compared to R. Pt also with mild discoloration in her L hand compared to the R. Pt was instructed to add towel squeezes for grip strength. Continue with skilled PT to progress toward goals set.     Rehab Potential  Good    PT Treatment/Interventions  Neuromuscular re-education;Canalith Repostioning;ADLs/Self Care Home Management;Cryotherapy;Electrical Stimulation;Moist Heat;Iontophoresis 4mg /ml Dexamethasone;Therapeutic activities;Therapeutic exercise;Patient/family education;Manual techniques;Passive range of motion;Taping;Dry needling    PT Next Visit Plan  continue with Epplys if needed. pulleys, ranger, isometrics 6 way, AAROM, PROM    PT Home Exercise Plan  Brandt- Daroff maneuver, putty grip, scap retraction, pendulum,  supine/ sitting cane AROM hand elbow. pulleys/table slides    Consulted and Agree with Plan of Care  Patient       Patient will benefit from skilled therapeutic intervention in order to improve the following deficits and impairments:  Dizziness, Improper body mechanics, Pain, Postural dysfunction, Increased muscle spasms, Decreased activity tolerance, Decreased range of motion, Decreased strength, Impaired UE functional use, Impaired flexibility, Increased edema  Visit Diagnosis: Closed displaced fracture of shaft of left clavicle with routine healing, subsequent encounter  BPPV (benign paroxysmal positional vertigo), left  Pain in left arm  Weakness of left arm     Problem List There are no active problems to display for this patient.   Sharmon LeydenJennifer R Vedansh Kerstetter, PT  06/27/2018, 10:07 AM  [facility] [address] [place], [place], [address] Phone: [phone]   Fax:   [phone]  Name: Pamela Hawkins MRN: [id number] Date of Birth: [birth year]

## 2018-06-28 ENCOUNTER — Ambulatory Visit: Payer: Medicaid Other | Admitting: Physical Therapy

## 2018-07-03 ENCOUNTER — Ambulatory Visit: Payer: Medicaid Other | Admitting: Physical Therapy

## 2018-07-05 ENCOUNTER — Ambulatory Visit: Payer: Medicaid Other | Admitting: Physical Therapy

## 2018-07-06 ENCOUNTER — Ambulatory Visit: Payer: Medicaid Other | Admitting: Physical Therapy

## 2018-07-06 ENCOUNTER — Other Ambulatory Visit: Payer: Self-pay

## 2018-07-06 DIAGNOSIS — S42022D Displaced fracture of shaft of left clavicle, subsequent encounter for fracture with routine healing: Secondary | ICD-10-CM

## 2018-07-06 DIAGNOSIS — H8112 Benign paroxysmal vertigo, left ear: Secondary | ICD-10-CM | POA: Diagnosis not present

## 2018-07-06 DIAGNOSIS — R29898 Other symptoms and signs involving the musculoskeletal system: Secondary | ICD-10-CM | POA: Diagnosis not present

## 2018-07-06 DIAGNOSIS — M79602 Pain in left arm: Secondary | ICD-10-CM

## 2018-07-06 NOTE — Therapy (Signed)
[facility] [address] [place], [place], [address] Phone: [phone]   Fax:  [phone]  Physical Therapy Treatment  Patient Details  Name: Pamela Hawkins MRN: [id number] Date of Birth: 1975/04/29 Referring Provider (PT): Nicki Guadalajara, MD   Encounter Date: 07/06/2018  PT End of Session - 07/06/18 1016    Visit Number  6    Number of Visits  16    Authorization Type  Medicaid- authorization request submitted 3/6 approved for 12 visits from 06/25/2018 to 08/05/2018    Authorization - Visit Number  6    Authorization - Number of Visits  16    PT Start Time  0930    PT Stop Time  1015    PT Time Calculation (min)  45 min    Activity Tolerance  Patient tolerated treatment well    Behavior During Therapy  [facility] for tasks assessed/performed       Past Medical History:  Diagnosis Date  . Ovarian cyst   . UTI (urinary tract infection)     Past Surgical History:  Procedure Laterality Date  . CHOLECYSTECTOMY      There were no vitals filed for this visit.  Subjective Assessment - 07/06/18 0948    Subjective  Reports not much dizziness, the exercises at home help a lot, reports she can move her arm better today with less swelling.    Currently in Pain?  Yes    Pain Score  1     Pain Location  Shoulder    Pain Orientation  Left    Pain Descriptors / Indicators  Aching    Pain Type  Acute pain                       [facility] Adult PT Treatment/Exercise - 07/06/18 0001      Shoulder Exercises: Standing   Extension  20 reps    Theraband Level (Shoulder Extension)  Level 1 (Yellow)    Row  20 reps    Theraband Level (Shoulder Row)  Level 1 (Yellow)      Shoulder Exercises: Pulleys   Flexion  3 minutes    ABduction  3 minutes      Shoulder Exercises: ROM/Strengthening   Ranger  15 times flex and scaption, then 10 circles CW/CCW    Other ROM/Strengthening Exercises  wall ladder for flexion and scaption x 10  ea, pendulums with 2 lb wt for light distraction circles  x20 ea    Other ROM/Strengthening Exercises  gripping green DGrip 2X25      Shoulder Exercises: Isometric Strengthening   Other Isometric Exercises  flexion, IR, ER, abd 5 sec X 10 ea      Manual Therapy   Manual therapy comments  light STM around clavicle, shoulder PROM and gentle distraction mobs               PT Short Term Goals - 06/26/18 1242      PT SHORT TERM GOAL #1   Title  Pt will report dizziness improved by ay least 75%.    Baseline  50% improvement per patient     Time  3    Period  Weeks    Status  Achieved      PT SHORT TERM GOAL #2   Title  improve AROM by at least 10 deg in flexion and abduction    Baseline  see flowsheet    Time  3  Period  Weeks    Status  On-going      PT SHORT TERM GOAL #3   Title  pt will be able to make a full fist    Baseline  able- unable to create force for carrying but can achieve position    Time  3    Period  Weeks    Status  Achieved        PT Long Term Goals - 06/26/18 1242      PT LONG TERM GOAL #1   Title  Pt will demonstrate negative Dix Hallpike testing to indicate resolution of BPPV and increase safety with mobility.     Baseline  Positive L Dix Hallpike and reported dizziness with increased fall risk    Time  12    Period  Weeks    Status  New      PT LONG TERM GOAL #2   Title  Pt will demonstrate functional AROM to achieve functional ranges to reach overhead for dressing and reaching to high cabinets    Baseline  unable at eval, see flowsheet for ROM measures    Time  12    Period  Weeks    Status  New      PT LONG TERM GOAL #3   Title  Pt will be able to grip and maneuver steering wheel as well as turn head to drive safely    Baseline  unable to drive at eval    Time  12    Period  Weeks    Status  New      PT LONG TERM GOAL #4   Title  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 Session ended with MT and she relays positve benefit from this. PT will continue to progress toward functional goals    Rehab Potential  Good    PT Treatment/Interventions  Neuromuscular re-education;Canalith Repostioning;ADLs/Self Care Home Management;Cryotherapy;Electrical Stimulation;Moist Heat;Iontophoresis /ml Dexamethasone;Therapeutic activities;Therapeutic exercise;Patient/family education;Manual techniques;Passive range of motion;Taping;Dry needling    PT Next Visit Plan  continue with Epplys if needed. pulleys, ranger, isometrics 6 way, AAROM, PROM    PT Home Exercise Plan  Brandt- Daroff maneuver, putty grip, scap retraction, pendulum,  supine/ sitting cane AROM hand elbow. pulleys/table slides    Consulted and Agree with Plan of Care  Patient       Patient will benefit from skilled therapeutic intervention in order to improve the following deficits and impairments:  Dizziness, Improper body mechanics, Pain, Postural dysfunction, Increased muscle spasms, Decreased activity tolerance, Decreased range of motion, Decreased strength, Impaired UE functional use, Impaired flexibility, Increased edema  Visit Diagnosis: Closed displaced fracture of shaft of left clavicle with routine healing, subsequent encounter  BPPV (benign  paroxysmal positional vertigo), left  Pain in left arm  Weakness of left arm     Problem List There are no active problems to display for this patient.   [name] 07/06/2018, 10:18 AM  [facility] [address] [place], [place], [address] Phone: [phone]   Fax:  [phone]  Name: Pamela Hawkins MRN: [id number] Date of Birth: 1975-06-11

## 2018-07-10 ENCOUNTER — Ambulatory Visit: Payer: Medicaid Other | Admitting: Physical Therapy

## 2018-07-10 ENCOUNTER — Other Ambulatory Visit: Payer: Self-pay

## 2018-07-10 ENCOUNTER — Encounter: Payer: Self-pay | Admitting: Physical Therapy

## 2018-07-10 DIAGNOSIS — H8112 Benign paroxysmal vertigo, left ear: Secondary | ICD-10-CM | POA: Diagnosis not present

## 2018-07-10 DIAGNOSIS — R29898 Other symptoms and signs involving the musculoskeletal system: Secondary | ICD-10-CM

## 2018-07-10 DIAGNOSIS — M79602 Pain in left arm: Secondary | ICD-10-CM

## 2018-07-10 DIAGNOSIS — S42022D Displaced fracture of shaft of left clavicle, subsequent encounter for fracture with routine healing: Secondary | ICD-10-CM | POA: Diagnosis not present

## 2018-07-10 NOTE — Therapy (Signed)
[facility] [address] [place], [place], [address] Phone: [phone]   Fax:  [phone]  Physical Therapy Treatment  Patient Details  Name: Pamela Hawkins MRN: [id number] Date of Birth: 12/16/1975 Referring Provider (PT): Nicki Guadalajarahris Adair, MD   Encounter Date: 07/10/2018  PT End of Session - 07/10/18 1029    Visit Number  7    Number of Visits  16    Authorization Type  Medicaid- authorization request submitted 3/6 approved for 12 visits from 06/25/2018 to 08/05/2018    Authorization - Visit Number  6    Authorization - Number of Visits  16    Activity Tolerance  Patient tolerated treatment well    Behavior During Therapy  [facility] for tasks assessed/performed       Past Medical History:  Diagnosis Date  . Ovarian cyst   . UTI (urinary tract infection)     Past Surgical History:  Procedure Laterality Date  . CHOLECYSTECTOMY      There were no vitals filed for this visit.  Subjective Assessment - 07/10/18 1013    Subjective  Patient is not having pain at this time. She feels like her shoulder and wrist are getting stronger. She is not having any pain today.     Patient Stated Goals  get arm back to working. reduce dizziness    Currently in Pain?  No/denies                       [facility] Adult [facility] Treatment/Exercise - 07/10/18 0001      Self-Care   Self-Care  Other Self-Care Comments    Other Self-Care Comments   reviewed HEP and how to put bands in the door for home program. Reviewed which exercixses to do at home.       Shoulder Exercises: Standing   Internal Rotation  20 reps    Theraband Level (Shoulder Internal Rotation)  Level 1 (Yellow)    Extension  20 reps    Theraband Level (Shoulder Extension)  Level 1 (Yellow)    Row  20 reps    Theraband Level (Shoulder Row)  Level 1 (Yellow)      Shoulder Exercises: Pulleys   Flexion  3 minutes      Shoulder Exercises: ROM/Strengthening   Ranger  15  times flex and scaption, then 10 circles CW/CCW    Other ROM/Strengthening Exercises  wall ladder for flexion and scaption x 10 ea, pendulums with 2 lb wt for light distraction circles  x20 ea    Other ROM/Strengthening Exercises  putty grip x15, key grip x15; finger tip grip x10       Manual Therapy   Manual therapy comments  light STM around clavicle, shoulder PROM and gentle distraction mobs             PT Education - 07/10/18 1028    Education Details  reviewed HEP     Person(s) Educated  Patient    Methods  Explanation;Demonstration;Tactile cues;Verbal cues    Comprehension  Verbalized understanding;Returned demonstration;Verbal cues required;Tactile cues required       PT Short Term Goals - 06/26/18 1242      PT SHORT TERM GOAL #1   Title  Pt will report dizziness improved by ay least 75%.    Baseline  50% improvement per patient     Time  3    Period  Weeks    Status  Achieved  PT SHORT TERM GOAL #2   Title  improve AROM by at least 10 deg in flexion and abduction    Baseline  see flowsheet    Time  3    Period  Weeks    Status  On-going      PT SHORT TERM GOAL #3   Title  pt will be able to make a full fist    Baseline  able- unable to create force for carrying but can achieve position    Time  3    Period  Weeks    Status  Achieved        PT Long Term Goals - 06/26/18 1242      PT LONG TERM GOAL #1   Title  Pt will demonstrate negative Dix Hallpike testing to indicate resolution of BPPV and increase safety with mobility.     Baseline  Positive L Dix Hallpike and reported dizziness with increased fall risk    Time  12    Period  Weeks    Status  New      PT LONG TERM GOAL #2   Title  Pt will demonstrate functional AROM to achieve functional ranges to reach overhead for dressing and reaching to high cabinets    Baseline  unable at eval, see flowsheet for ROM measures    Time  12    Period  Weeks    Status  New      PT LONG TERM GOAL #3    Title  Pt will be able to grip and maneuver steering wheel as well as turn head to drive safely    Baseline  unable to drive at eval    Time  12    Period  Weeks    Status  New      PT LONG TERM GOAL #4   Title  Pt will be able to manuever patients for bed mobility in order to return to work, pain <=3/10    Baseline  unable at eval    Time  12    Period  Weeks    Status  New      PT LONG TERM GOAL #5   Title  pt will be able to complete all household and child care activities without limitation by UE pain    Baseline  unable at eval    Time  12    Period  Weeks            Plan - 07/10/18 1038    Clinical Impression Statement  Patient was given putty to work on at home. She was shon a squeeze, a key grip, and pinch. She toelrated well. She had no increase in pain. She did well with her band exercises. She was given her red band and a yellow band for home to work on stretching and exercises. Her shoulder motion is full.     Stability/Clinical Decision Making  Unstable/Unpredictable    Clinical Decision Making  Low    Rehab Potential  Good    PT Treatment/Interventions  Neuromuscular re-education;Canalith Repostioning;ADLs/Self Care Home Management;Cryotherapy;Electrical Stimulation;Moist Heat;Iontophoresis 4mg /ml Dexamethasone;Therapeutic activities;Therapeutic exercise;Patient/family education;Manual techniques;Passive range of motion;Taping;Dry needling    PT Next Visit Plan  continue with Epplys if needed. pulleys, ranger, isometrics 6 way, AAROM, PROM    PT Home Exercise Plan  Brandt- Daroff maneuver, putty grip, scap retraction, pendulum,  supine/ sitting cane AROM hand elbow. pulleys/table slides    Consulted and Agree with Plan of Care  Patient       Patient will benefit from skilled therapeutic intervention in order to improve the following deficits and impairments:  Dizziness, Improper body mechanics, Pain, Postural dysfunction, Increased muscle spasms, Decreased activity  tolerance, Decreased range of motion, Decreased strength, Impaired UE functional use, Impaired flexibility, Increased edema  Visit Diagnosis: Closed displaced fracture of shaft of left clavicle with routine healing, subsequent encounter  BPPV (benign paroxysmal positional vertigo), left  Pain in left arm  Weakness of left arm     Problem List There are no active problems to display for this patient.   Dessie Coma PT DPT  07/10/2018, 11:34 AM  [facility] [address] [place], [place], [address] Phone: [phone]   Fax:  [phone]  Name: Pamela Hawkins MRN: [id number] Date of Birth: 16-Nov-1975

## 2018-07-11 DIAGNOSIS — S42011A Anterior displaced fracture of sternal end of right clavicle, initial encounter for closed fracture: Secondary | ICD-10-CM | POA: Diagnosis not present

## 2018-07-12 ENCOUNTER — Ambulatory Visit: Payer: Medicaid Other | Admitting: Physical Therapy

## 2018-07-12 ENCOUNTER — Other Ambulatory Visit: Payer: Self-pay

## 2018-07-12 ENCOUNTER — Ambulatory Visit: Payer: Medicaid Other | Attending: Orthopaedic Surgery | Admitting: Physical Therapy

## 2018-07-12 DIAGNOSIS — R29898 Other symptoms and signs involving the musculoskeletal system: Secondary | ICD-10-CM | POA: Insufficient documentation

## 2018-07-12 DIAGNOSIS — S42022D Displaced fracture of shaft of left clavicle, subsequent encounter for fracture with routine healing: Secondary | ICD-10-CM | POA: Insufficient documentation

## 2018-07-12 DIAGNOSIS — M79602 Pain in left arm: Secondary | ICD-10-CM | POA: Diagnosis not present

## 2018-07-12 DIAGNOSIS — H8112 Benign paroxysmal vertigo, left ear: Secondary | ICD-10-CM | POA: Insufficient documentation

## 2018-07-12 NOTE — Therapy (Signed)
[place], [place], [address] Phone: [phone]   Fax:  [phone]  Physical Therapy Treatment  Patient Details  Name: Pamela Hawkins MRN: [id number] Date of Birth: [birth year] Referring Provider (PT): Radene Journey, MD   Encounter Date: 07/12/2018  PT End of Session - 07/12/18 0958    Visit Number  8    Number of Visits  16    Authorization Type  Medicaid- authorization request submitted 3/6 approved for 12 visits from 06/25/2018 to 08/05/2018    Authorization - Visit Number  7    Authorization - Number of Visits  16    PT Start Time  0915    PT Stop Time  0958    PT Time Calculation (min)  43 min    Activity Tolerance  Patient tolerated treatment well    Behavior During Therapy  [facility] for tasks assessed/performed       Past Medical History:  Diagnosis Date  . Ovarian cyst   . UTI (urinary tract infection)     Past Surgical History:  Procedure Laterality Date  . CHOLECYSTECTOMY      There were no vitals filed for this visit.  Subjective Assessment - 07/12/18 0927    Subjective  Pt relays some shoulder soreness but overall it is improving. She says dizziness has completely resolved   Currently in Pain?  No/denies                       [facility] Adult PT Treatment/Exercise - 07/12/18 0001      Shoulder Exercises: Standing   External Rotation  Left;20 reps    Theraband Level (Shoulder External Rotation)  Level 2 (Red)    Internal Rotation  20 reps;Left    Theraband Level (Shoulder Internal Rotation)  Level 2 (Red)    Extension  20 reps    Theraband Level (Shoulder Extension)  Level 2 (Red)    Row  20 reps    Theraband Level (Shoulder Row)  Level 2 (Red)    Other Standing Exercises  elbow flexion and pronaton/supination 3 lbs X 20 ea      Shoulder Exercises: Pulleys   Flexion  3 minutes    ABduction  2 minutes      Shoulder Exercises: ROM/Strengthening   Ranger  20 times flex  and scaption, then 20 circles CW/CCW    Other ROM/Strengthening Exercises  wall ladder for flexion and scaption x 10 ea, pendulums with 3 lb wt for light distraction circles  x20 ea    Other ROM/Strengthening Exercises  Green D grip 2X25, then with finger opposition X 10 each finger      Shoulder Exercises: Stretch   Corner Stretch Limitations  Lt arm on doorway for unilat pec stretch 30 sec X 5      Manual Therapy   Manual therapy comments  light STM around clavicle, shoulder PROM and gentle distraction mobs               PT Short Term Goals - 07/12/18 1000      PT SHORT TERM GOAL #1   Title  Pt will report dizziness improved by ay least 75%.    Baseline  now resolved    Time  3    Period  Weeks    Status  Achieved    Target Date  06/20/18      PT SHORT TERM GOAL #2   Title  improve AROM by  at least 10 deg in flexion and abduction    Baseline  ROM WFL    Time  3    Period  Weeks    Status  Achieved      PT SHORT TERM GOAL #3   Title  pt will be able to make a full fist    Baseline  able to achieve and carry at least 3 lbs    Time  3    Period  Weeks    Status  Achieved        PT Long Term Goals - 06/26/18 1242      PT LONG TERM GOAL #1   Title  Pt will demonstrate negative Dix Hallpike testing to indicate resolution of BPPV and increase safety with mobility.     Baseline  Positive L Dix Hallpike and reported dizziness with increased fall risk    Time  12    Period  Weeks    Status  New      PT LONG TERM GOAL #2   Title  Pt will demonstrate functional AROM to achieve functional ranges to reach overhead for dressing and reaching to high cabinets    Baseline  unable at eval, see flowsheet for ROM measures    Time  12    Period  Weeks    Status  New      PT LONG TERM GOAL #3   Title  Pt will be able to grip and maneuver steering wheel as well as turn head to drive safely    Baseline  unable to drive at eval    Time  12    Period  Weeks    Status  New       PT LONG TERM GOAL #4   Title  Pt will be able to manuever patients for bed mobility in order to return to work, pain <=3/10    Baseline  unable at eval    Time  12    Period  Weeks    Status  New      PT LONG TERM GOAL #5   Title  pt will be able to complete all household and child care activities without limitation by UE pain    Baseline  unable at eval    Time  12    Period  Weeks            Plan - 07/12/18 5188    Clinical Impression Statement  She was able to progress her strengthening and stretching program with good tolreance and is making great progress in these. She has now met all of her STG.  She is also increasing funcitonal use of her Lt UE. She will continue to benefit from PT to progress her functional goals.     Stability/Clinical Decision Making  Unstable/Unpredictable    Rehab Potential  Good    PT Treatment/Interventions  Neuromuscular re-education;Canalith Repostioning;ADLs/Self Care Home Management;Cryotherapy;Electrical Stimulation;Moist Heat;Iontophoresis 39m/ml Dexamethasone;Therapeutic activities;Therapeutic exercise;Patient/family education;Manual techniques;Passive range of motion;Taping;Dry needling    PT Next Visit Plan  progress Lt shoulder ROM and strength as tolerated    PT Home Exercise Plan  Brandt- Daroff maneuver, putty grip, scap retraction, pendulum,  supine/ sitting cane AROM hand elbow. pulleys/table slides    Consulted and Agree with Plan of Care  Patient       Patient will benefit from skilled therapeutic intervention in order to improve the following deficits and impairments:  Dizziness, Improper body mechanics, Pain, Postural dysfunction, Increased muscle  spasms, Decreased activity tolerance, Decreased range of motion, Decreased strength, Impaired UE functional use, Impaired flexibility, Increased edema  Visit Diagnosis: Closed displaced fracture of shaft of left clavicle with routine healing, subsequent encounter  BPPV (benign  paroxysmal positional vertigo), left  Pain in left arm  Weakness of left arm     Problem List There are no active problems to display for this patient.   Silvestre Mesi 07/12/2018, 10:02 AM  [facility] [address] [place], [place], [address] Phone: [phone]   Fax:  [phone]  Name: Pamela Hawkins MRN: [id number] Date of Birth: 1975/10/22

## 2018-07-17 ENCOUNTER — Other Ambulatory Visit: Payer: Self-pay

## 2018-07-17 ENCOUNTER — Ambulatory Visit: Payer: Medicaid Other | Admitting: Physical Therapy

## 2018-07-17 DIAGNOSIS — S42022D Displaced fracture of shaft of left clavicle, subsequent encounter for fracture with routine healing: Secondary | ICD-10-CM

## 2018-07-17 DIAGNOSIS — H8112 Benign paroxysmal vertigo, left ear: Secondary | ICD-10-CM

## 2018-07-17 DIAGNOSIS — M79602 Pain in left arm: Secondary | ICD-10-CM

## 2018-07-17 DIAGNOSIS — R29898 Other symptoms and signs involving the musculoskeletal system: Secondary | ICD-10-CM | POA: Diagnosis not present

## 2018-07-18 NOTE — Therapy (Signed)
[facility] [address] [place], [place], [address] Phone: [phone]   Fax:  [phone]  Physical Therapy Treatment  Patient Details  Name: Pamela MourningKimberly Schoenberg MRN: [id number] Date of Birth: 10/15/1975 Referring Provider (PT): Nicki Guadalajarahris Adair, MD   Encounter Date: 07/17/2018  PT End of Session - 07/17/18 1037    Visit Number  9    Number of Visits  16    Authorization Type  Medicaid- authorization request submitted 3/6 approved for 12 visits from 06/25/2018 to 08/05/2018    PT Start Time  1011    PT Stop Time  1052    PT Time Calculation (min)  41 min    Activity Tolerance  Patient tolerated treatment well    Behavior During Therapy  [facility] for tasks assessed/performed       Past Medical History:  Diagnosis Date  . Ovarian cyst   . UTI (urinary tract infection)     Past Surgical History:  Procedure Laterality Date  . CHOLECYSTECTOMY      There were no vitals filed for this visit.  Subjective Assessment - 07/17/18 1024    Subjective  Patient reports it gets sore at times. She alos reports some swelling but it is doing better. She is not having pain this morning.     Patient Stated Goals  get arm back to working. reduce dizziness    Currently in Pain?  No/denies                       [facility] Treatment/Exercise - 07/18/18 0001      Shoulder Exercises: Standing   External Rotation  Left;20 reps    Theraband Level (Shoulder External Rotation)  Level 2 (Red)    Internal Rotation  20 reps;Left    Theraband Level (Shoulder Internal Rotation)  Level 2 (Red)    Extension  20 reps    Theraband Level (Shoulder Extension)  Level 3 (Green)    Row  20 reps    Theraband Level (Shoulder Row)  Level 3 (Green)    Other Standing Exercises  elbow flexion and pronaton/supination 3 lbs X 20 ea      Shoulder Exercises: Pulleys   Flexion  3 minutes      Shoulder Exercises: ROM/Strengthening   Ranger  20 times flex  and scaption, then 20 circles CW/CCW    Other ROM/Strengthening Exercises  wall ladder for flexion and scaption x 10 ea, pendulums with 3 lb wt for light distraction circles  x20 ea      Manual Therapy   Manual therapy comments  light STM around clavicle, shoulder PROM and gentle distraction mobs             PT Education - 07/17/18 1036    Education Details  reviewed functional strengthening    Person(s) Educated  Patient    Methods  Explanation;Demonstration;Tactile cues;Verbal cues    Comprehension  Verbalized understanding;Returned demonstration;Verbal cues required;Tactile cues required       PT Short Term Goals - 07/12/18 1000      PT SHORT TERM GOAL #1   Title  Pt will report dizziness improved by ay least 75%.    Baseline  now resolved    Time  3    Period  Weeks    Status  Achieved    Target Date  06/20/18      PT SHORT TERM GOAL #2   Title  improve AROM by at least  10 deg in flexion and abduction    Baseline  ROM WFL    Time  3    Period  Weeks    Status  Achieved      PT SHORT TERM GOAL #3   Title  pt will be able to make a full fist    Baseline  able to achieve and carry at least 3 lbs    Time  3    Period  Weeks    Status  Achieved        PT Long Term Goals - 06/26/18 1242      PT LONG TERM GOAL #1   Title  Pt will demonstrate negative Dix Hallpike testing to indicate resolution of BPPV and increase safety with mobility.     Baseline  Positive L Dix Hallpike and reported dizziness with increased fall risk    Time  12    Period  Weeks    Status  New      PT LONG TERM GOAL #2   Title  Pt will demonstrate functional AROM to achieve functional ranges to reach overhead for dressing and reaching to high cabinets    Baseline  unable at eval, see flowsheet for ROM measures    Time  12    Period  Weeks    Status  New      PT LONG TERM GOAL #3   Title  Pt will be able to grip and maneuver steering wheel as well as turn head to drive safely     Baseline  unable to drive at eval    Time  12    Period  Weeks    Status  New      PT LONG TERM GOAL #4   Title  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 She had no significant increase in pain. She reported some fatigu. She will continue to work on her exercises at home.     Stability/Clinical Decision Making  Unstable/Unpredictable    Clinical Decision Making  Low    Rehab Potential  Good    PT Frequency  2x / week    PT Duration  8 weeks    PT Treatment/Interventions  Neuromuscular re-education;Canalith Repostioning;ADLs/Self Care Home Management;Cryotherapy;Electrical Stimulation;Moist Heat;Iontophoresis 4mg /ml Dexamethasone;Therapeutic activities;Therapeutic exercise;Patient/family education;Manual techniques;Passive range of motion;Taping;Dry needling    PT Next Visit Plan  progress Lt shoulder ROM and strength as tolerated    PT Home Exercise Plan  Brandt- Daroff maneuver, putty grip, scap retraction, pendulum,  supine/ sitting cane AROM hand elbow. pulleys/table slides    Consulted and Agree with Plan of Care  Patient       Patient will benefit from skilled therapeutic intervention in order to improve the following deficits and impairments:  Dizziness, Improper body mechanics, Pain,  Postural dysfunction, Increased muscle spasms, Decreased activity tolerance, Decreased range of motion, Decreased strength, Impaired UE functional use, Impaired flexibility, Increased edema  Visit Diagnosis: Closed displaced fracture of  shaft of left clavicle with routine healing, subsequent encounter  BPPV (benign paroxysmal positional vertigo), left  Pain in left arm  Weakness of left arm     Problem List There are no active problems to display for this patient.   Dessie Coma PT DPT  07/18/2018, 11:13 AM  [facility] [address] [place], [place], [address] Phone: [phone]   Fax:  [phone]  Name: Kaysha Parsell MRN: [id number] Date of Birth: 1975/12/16

## 2018-07-19 ENCOUNTER — Ambulatory Visit: Payer: Medicaid Other

## 2018-07-19 ENCOUNTER — Other Ambulatory Visit: Payer: Self-pay

## 2018-07-19 DIAGNOSIS — H8112 Benign paroxysmal vertigo, left ear: Secondary | ICD-10-CM | POA: Diagnosis not present

## 2018-07-19 DIAGNOSIS — R29898 Other symptoms and signs involving the musculoskeletal system: Secondary | ICD-10-CM | POA: Diagnosis not present

## 2018-07-19 DIAGNOSIS — M79602 Pain in left arm: Secondary | ICD-10-CM | POA: Diagnosis not present

## 2018-07-19 DIAGNOSIS — S42022D Displaced fracture of shaft of left clavicle, subsequent encounter for fracture with routine healing: Secondary | ICD-10-CM | POA: Diagnosis not present

## 2018-07-19 NOTE — Therapy (Signed)
[facility] [address] [place], [place], [address] Phone: [phone]   Fax:  [phone]  Physical Therapy Treatment  Patient Details  Name: Pamela Hawkins MRN: [id number] Date of Birth: July 08, 1975 Referring Provider (PT): Nicki Guadalajara, MD   Encounter Date: 07/19/2018  PT End of Session - 07/19/18 1015    Visit Number  10    Number of Visits  16    Authorization Type  Medicaid- authorization request submitted 3/6 approved for 12 visits from 06/25/2018 to 08/05/2018    Authorization - Visit Number  8    Authorization - Number of Visits  16    PT Start Time  1010    PT Stop Time  1055    PT Time Calculation (min)  45 min    Activity Tolerance  Patient tolerated treatment well    Behavior During Therapy  [facility] for tasks assessed/performed       Past Medical History:  Diagnosis Date  . Ovarian cyst   . UTI (urinary tract infection)     Past Surgical History:  Procedure Laterality Date  . CHOLECYSTECTOMY      There were no vitals filed for this visit.  Subjective Assessment - 07/19/18 1016    Subjective  Mild pain , arm is feeling better    Pain Score  1     Pain Location  Shoulder    Pain Orientation  Left    Pain Descriptors / Indicators  Aching    Pain Type  Acute pain    Pain Radiating Towards  anterior chest    Pain Onset  More than a month ago    Aggravating Factors   moving arm    Pain Relieving Factors  ice         [facility] PT Assessment - 07/19/18 0001      Assessment   Medical Diagnosis  Lt clavicle fracture    Referring Provider (PT)  Nicki Guadalajara, MD      ROM / Strength   AROM / PROM / Strength  Strength      AROM   Left Shoulder Flexion  150 Degrees    Left Shoulder ABduction  147 Degrees    Left Shoulder Internal Rotation  50 Degrees    Left Shoulder External Rotation  80 Degrees      Strength   Overall Strength Comments  Lt arm strength 4+/5, grip LT   35  pounds RT 78 pounds                    [facility] Adult PT Treatment/Exercise - 07/19/18 0001      Shoulder Exercises: Seated   Other Seated Exercises  scapular retraction x 15 holding 3 seconds, with wall slides and reaching toward ceiling      Other Seated Exercises  elbow flexion 3# x 20 reps, elbow pronation and supination 3# x 20 reps      Shoulder Exercises: Standing   External Rotation  Left;20 reps    Theraband Level (Shoulder External Rotation)  Level 3 (Green)    Internal Rotation  20 reps;Left    Theraband Level (Shoulder Internal Rotation)  Level 3 (Green)    Extension  20 reps    Theraband Level (Shoulder Extension)  Level 3 (Green)    Row  20 reps    Theraband Level (Shoulder Row)  Level 3 (Green)      Shoulder Exercises: Pulleys   Flexion  3 minutes  ABduction  3 minutes      Shoulder Exercises: ROM/Strengthening   Ranger  20 times flex and scaption, then 20 circles CW/CCW    Other ROM/Strengthening Exercises  wall ladder for flexion and scaption x 10 ea, pendulums with 3 lb wt for light distraction circles  x20 ea             PT Education - 07/19/18 1052    Education Details  Yellow putty issued and instructed in use for grip and finger strength 3-5 min 2x/day    Person(s) Educated  Patient    Methods  Explanation;Demonstration;Verbal cues    Comprehension  Verbalized understanding;Returned demonstration       PT Short Term Goals - 07/12/18 1000      PT SHORT TERM GOAL #1   Title  Pt will report dizziness improved by ay least 75%.    Baseline  now resolved    Time  3    Period  Weeks    Status  Achieved    Target Date  06/20/18      PT SHORT TERM GOAL #2   Title  improve AROM by at least 10 deg in flexion and abduction    Baseline  ROM WFL    Time  3    Period  Weeks    Status  Achieved      PT SHORT TERM GOAL #3   Title  pt will be able to make a full fist    Baseline  able to achieve and carry at least 3 lbs    Time  3    Period  Weeks    Status   Achieved        PT Long Term Goals - 07/19/18 1100      PT LONG TERM GOAL #2   Title  Pt will demonstrate functional AROM to achieve functional ranges to reach overhead for dressing and reaching to high cabinets    Baseline  weakness and pain  limits    Status  Achieved      PT LONG TERM GOAL #3   Title  she report s improved    Status  On-going      PT LONG TERM GOAL #4   Title  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 ROM appers to be [facility] passive and active with effort but still slightly less than RT. Grip strength improved.       PT Treatment/Interventions  Neuromuscular re-education;Canalith Repostioning;ADLs/Self Care Home Management;Cryotherapy;Electrical Stimulation;Moist Heat;Iontophoresis 4mg /ml Dexamethasone;Therapeutic activities;Therapeutic exercise;Patient/family education;Manual techniques;Passive range of motion;Taping;Dry needling    PT Next Visit Plan  progress Lt shoulder ROM and strength as tolerated review putty exercises    PT Home Exercise Plan  Brandt- Daroff maneuver, putty grip, scap retraction, pendulum,  supine/ sitting cane AROM hand elbow. pulleys/table slides    Consulted and Agree with Plan of Care  Patient       Patient will benefit from skilled therapeutic intervention in order to improve the following deficits and impairments:  Dizziness,  Improper body mechanics, Pain, Postural dysfunction, Increased muscle spasms, Decreased activity tolerance, Decreased range of motion, Decreased strength, Impaired UE functional use, Impaired flexibility, Increased edema  Visit Diagnosis: Closed displaced fracture of shaft of left clavicle  with routine healing, subsequent encounter  Weakness of left arm  Pain in left arm     Problem List There are no active problems to display for this patient.   Caprice Red  PT 07/19/2018, 11:02 AM  [facility] [address] [place], [place], [address] Phone: [phone]   Fax:  [phone]  Name: Pamela Hawkins MRN: [id number] Date of Birth: [birth year]

## 2018-07-19 NOTE — Patient Instructions (Signed)
Yellow putty with various manipulations for finger and hand strength 2x/day 3-5 min each.   Wall slides x 5 2-3x/day with reach to ceiling and scap retraction

## 2018-07-24 ENCOUNTER — Ambulatory Visit: Payer: Medicaid Other

## 2018-07-24 ENCOUNTER — Other Ambulatory Visit: Payer: Self-pay

## 2018-07-24 ENCOUNTER — Encounter: Payer: Self-pay | Admitting: Physical Therapy

## 2018-07-24 DIAGNOSIS — S42022D Displaced fracture of shaft of left clavicle, subsequent encounter for fracture with routine healing: Secondary | ICD-10-CM

## 2018-07-24 DIAGNOSIS — H8112 Benign paroxysmal vertigo, left ear: Secondary | ICD-10-CM | POA: Diagnosis not present

## 2018-07-24 DIAGNOSIS — M79602 Pain in left arm: Secondary | ICD-10-CM

## 2018-07-24 DIAGNOSIS — R29898 Other symptoms and signs involving the musculoskeletal system: Secondary | ICD-10-CM

## 2018-07-24 NOTE — Therapy (Signed)
[facility] [address] [place], [place], [address] Phone: [phone]   Fax:  [phone]  Physical Therapy Treatment  Patient Details  Name: Pamela MourningKimberly Hawkins MRN: [id number] Date of Birth: 01/23/1976 Referring Provider (PT): Nicki Guadalajarahris Adair, MD   Encounter Date: 07/24/2018  PT End of Session - 07/24/18 1027    Visit Number  11    Number of Visits  16    Authorization Type  Medicaid- authorization request submitted 3/6 approved for 12 visits from 06/25/2018 to 08/05/2018    Authorization - Visit Number  9    Authorization - Number of Visits  16    PT Start Time  1020    PT Stop Time  1100    PT Time Calculation (min)  40 min       Past Medical History:  Diagnosis Date  . Ovarian cyst   . UTI (urinary tract infection)     Past Surgical History:  Procedure Laterality Date  . CHOLECYSTECTOMY      There were no vitals filed for this visit.  Subjective Assessment - 07/24/18 1029    Subjective  Shoulder is doing OK.     Currently in Pain?  Yes    Pain Score  1     Pain Location  Shoulder    Pain Orientation  Left    Pain Descriptors / Indicators  Aching    Pain Radiating Towards  anterior    Pain Frequency  Intermittent    Aggravating Factors   using arm    Pain Relieving Factors  ice         [facility] PT Assessment - 07/24/18 0001      AROM   Left Shoulder Flexion  145 Degrees    Left Shoulder ABduction  165 Degrees   cued for effort   Left Shoulder Internal Rotation  50 Degrees    Left Shoulder External Rotation  90 Degrees                   [facility] Adult PT Treatment/Exercise - 07/24/18 0001      Shoulder Exercises: Seated   Other Seated Exercises  scapular retraction x 15 holding 3 seconds, with wall slides and reaching toward ceiling      Other Seated Exercises  elbow flexion 4# x 20 reps, elbow pronation and supination 3# x 20 reps      Shoulder Exercises: Standing   External Rotation  Left;20  reps    Theraband Level (Shoulder External Rotation)  Level 4 (Blue)    Internal Rotation  20 reps;Left    Theraband Level (Shoulder Internal Rotation)  Level 4 (Blue)    Extension  20 reps    Theraband Level (Shoulder Extension)  Level 4 (Blue)    Row  20 reps    Theraband Level (Shoulder Row)  Level 4 (Blue)    Other Standing Exercises  bent over row x20 5 pounds      Shoulder Exercises: Pulleys   Flexion  3 minutes    ABduction  3 minutes      Shoulder Exercises: ROM/Strengthening   UBE (Upper Arm Bike)  L1 4 min    Ranger  20 times flex and scaption, then 20 circles CW/CCW    Other ROM/Strengthening Exercises  carry 15 pounds x 350 feet without pain .                PT Short Term Goals - 07/12/18 1000  PT SHORT TERM GOAL #1   Title  Pt will report dizziness improved by ay least 75%.    Baseline  now resolved    Time  3    Period  Weeks    Status  Achieved    Target Date  06/20/18      PT SHORT TERM GOAL #2   Title  improve AROM by at least 10 deg in flexion and abduction    Baseline  ROM WFL    Time  3    Period  Weeks    Status  Achieved      PT SHORT TERM GOAL #3   Title  pt will be able to make a full fist    Baseline  able to achieve and carry at least 3 lbs    Time  3    Period  Weeks    Status  Achieved        PT Long Term Goals - 07/19/18 1100      PT LONG TERM GOAL #2   Title  Pt will demonstrate functional AROM to achieve functional ranges to reach overhead for dressing and reaching to high cabinets    Baseline  weakness and pain  limits    Status  Achieved      PT LONG TERM GOAL #3   Title  she report s improved    Status  On-going      PT LONG TERM GOAL #4   Title  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  tolerating  increased load without pain.  She will progress to blue band and carrying at [facility]     PT Treatment/Interventions  Neuromuscular re-education;Canalith Repostioning;ADLs/Self Care Home Management;Cryotherapy;Electrical Stimulation;Moist Heat;Iontophoresis 4mg /ml Dexamethasone;Therapeutic activities;Therapeutic exercise;Patient/family education;Manual techniques;Passive range of motion;Taping;Dry needling    PT Next Visit Plan  progress Lt shoulder ROM and strength as tolerated review putty exercises    PT Home Exercise Plan  Brandt- Daroff maneuver, putty grip, scap retraction, pendulum,  supine/ sitting cane AROM hand elbow. pulleys/table slides, blue band , carry at home.     Consulted and Agree with Plan of Care  Patient       Patient will benefit from skilled therapeutic intervention in order to improve the following deficits and impairments:  Dizziness, Improper body mechanics, Pain, Postural dysfunction, Increased muscle spasms, Decreased activity tolerance, Decreased range of motion, Decreased strength, Impaired UE functional use, Impaired flexibility, Increased edema  Visit Diagnosis: Closed displaced fracture of shaft of left clavicle with routine healing, subsequent encounter  Weakness of left arm  Pain in left arm     Problem List There are no active problems to display for this patient.   Pamela Hawkins   PT 07/24/2018, 11:02 AM  [facility] [address] [place], [place], [address] Phone: [phone]   Fax:  [phone]  Name: Pamela Hawkins MRN: [id number] Date of Birth: [birth year]

## 2018-07-26 ENCOUNTER — Ambulatory Visit: Payer: Medicaid Other | Admitting: Physical Therapy

## 2018-07-26 ENCOUNTER — Encounter: Payer: Self-pay | Admitting: Physical Therapy

## 2018-07-26 ENCOUNTER — Other Ambulatory Visit: Payer: Self-pay

## 2018-07-26 DIAGNOSIS — H8112 Benign paroxysmal vertigo, left ear: Secondary | ICD-10-CM | POA: Diagnosis not present

## 2018-07-26 DIAGNOSIS — S42022D Displaced fracture of shaft of left clavicle, subsequent encounter for fracture with routine healing: Secondary | ICD-10-CM | POA: Diagnosis not present

## 2018-07-26 DIAGNOSIS — M79602 Pain in left arm: Secondary | ICD-10-CM

## 2018-07-26 DIAGNOSIS — R29898 Other symptoms and signs involving the musculoskeletal system: Secondary | ICD-10-CM

## 2018-07-26 NOTE — Therapy (Signed)
[facility] [address] [place], [place], [address] Phone: [phone]   Fax:  [phone]  Physical Therapy Treatment  Patient Details  Name: Pamela MourningKimberly Hawkins MRN: [id number] Date of Birth: 12/25/1975 Referring Provider (PT): Nicki Guadalajarahris Adair, MD   Encounter Date: 07/26/2018  PT End of Session - 07/26/18 1057    Visit Number  12    Number of Visits  16    Authorization Type  Medicaid- authorization request submitted 3/6 approved for 12 visits from 06/25/2018 to 08/05/2018    Authorization - Visit Number  16    PT Start Time  1015    PT Stop Time  1056    PT Time Calculation (min)  41 min    Activity Tolerance  Patient tolerated treatment well    Behavior During Therapy  [facility] for tasks assessed/performed       Past Medical History:  Diagnosis Date  . Ovarian cyst   . UTI (urinary tract infection)     Past Surgical History:  Procedure Laterality Date  . CHOLECYSTECTOMY      There were no vitals filed for this visit.  Subjective Assessment - 07/26/18 1034    Subjective  Shoulder feels achy from working it out but overall its good    Patient Stated Goals  get arm back to working. reduce dizziness    Currently in Pain?  Yes    Pain Score  1     Pain Location  Shoulder    Pain Orientation  Left    Pain Descriptors / Indicators  Aching                       [facility] Adult PT Treatment/Exercise - 07/26/18 0001      Shoulder Exercises: Supine   Protraction  Left;20 reps    Protraction Weight (lbs)  3      Shoulder Exercises: Seated   Other Seated Exercises  elbow flexion 4# x 25 reps, elbow pronation and supination 3# x 20 reps      Shoulder Exercises: Standing   External Rotation  Left;20 reps    Theraband Level (Shoulder External Rotation)  Level 4 (Blue)    Internal Rotation  20 reps;Left    Theraband Level (Shoulder Internal Rotation)  Level 4 (Blue)    Extension  20 reps    Theraband Level (Shoulder  Extension)  Level 4 (Blue)    Row  20 reps    Theraband Level (Shoulder Row)  Level 4 (Blue)    Other Standing Exercises  bent over row x20 5 pounds, lifting flexion to 2nd shelf in cabinet 3 lbs X 10, farmers carry 15 lbs in Lt  had one lap    Other Standing Exercises  shoulder star red band X 5      Shoulder Exercises: Pulleys   Flexion  3 minutes    ABduction  3 minutes      Shoulder Exercises: ROM/Strengthening   Ranger  20 times flex and scaption, then 20 circles CW/CCW      Manual Therapy   Manual therapy comments  PROM all planes and GH distraction mobs               PT Short Term Goals - 07/12/18 1000      PT SHORT TERM GOAL #1   Title  Pt will report dizziness improved by ay least 75%.    Baseline  now resolved    Time  3  Period  Weeks    Status  Achieved    Target Date  06/20/18      PT SHORT TERM GOAL #2   Title  improve AROM by at least 10 deg in flexion and abduction    Baseline  ROM WFL    Time  3    Period  Weeks    Status  Achieved      PT SHORT TERM GOAL #3   Title  pt will be able to make a full fist    Baseline  able to achieve and carry at least 3 lbs    Time  3    Period  Weeks    Status  Achieved        PT Long Term Goals - 07/19/18 1100      PT LONG TERM GOAL #2   Title  Pt will demonstrate functional AROM to achieve functional ranges to reach overhead for dressing and reaching to high cabinets    Baseline  weakness and pain  limits    Status  Achieved      PT LONG TERM GOAL #3   Title  she report s improved    Status  On-going      PT LONG TERM GOAL #4   Title  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 PT will continue to progress functional use of her Lt shoulder.     Rehab Potential  Good    PT Frequency  2x / week    PT Duration  8 weeks    PT Next Visit Plan  progress Lt shoulder ROM and strength as tolerated review putty exercises    PT Home Exercise Plan  Brandt- Daroff maneuver, putty grip, scap retraction, pendulum,  supine/ sitting cane AROM hand elbow. pulleys/table slides, blue band , carry at home.     Consulted and Agree with Plan of Care  Patient       Patient will benefit from skilled therapeutic intervention in order to improve the following deficits and impairments:  Dizziness, Improper body mechanics, Pain, Postural dysfunction, Increased muscle spasms, Decreased activity tolerance, Decreased range of motion, Decreased strength, Impaired UE functional use, Impaired flexibility, Increased edema  Visit Diagnosis: Closed displaced fracture of shaft of left clavicle with routine healing, subsequent encounter  Weakness of left arm  Pain in left arm  BPPV (benign paroxysmal positional vertigo), left     Problem List There are no active problems to display for this patient.   [name] 07/26/2018, 11:03 AM  [facility] [address] [place], [place], [address] Phone: [phone]   Fax:  [phone]  Name: Pamela Hawkins MRN: [id number] Date of Birth: 01-17-1976

## 2018-07-31 ENCOUNTER — Encounter: Payer: Self-pay | Admitting: Physical Therapy

## 2018-08-01 ENCOUNTER — Ambulatory Visit: Payer: Medicaid Other | Admitting: Physical Therapy

## 2018-08-01 ENCOUNTER — Other Ambulatory Visit: Payer: Self-pay

## 2018-08-01 DIAGNOSIS — R29898 Other symptoms and signs involving the musculoskeletal system: Secondary | ICD-10-CM

## 2018-08-01 DIAGNOSIS — S42022D Displaced fracture of shaft of left clavicle, subsequent encounter for fracture with routine healing: Secondary | ICD-10-CM | POA: Diagnosis not present

## 2018-08-01 DIAGNOSIS — H8112 Benign paroxysmal vertigo, left ear: Secondary | ICD-10-CM | POA: Diagnosis not present

## 2018-08-01 DIAGNOSIS — M79602 Pain in left arm: Secondary | ICD-10-CM | POA: Diagnosis not present

## 2018-08-01 NOTE — Therapy (Signed)
[facility] [address] [place], [place], [address] Phone: [phone]   Fax:  [phone]  Physical Therapy Treatment  Patient Details  Name: Pamela Hawkins MRN: [id number] Date of Birth: 25-Aug-1975 Referring Provider (PT): Nicki Guadalajara, MD   Encounter Date: 08/01/2018  PT End of Session - 08/01/18 1104    Visit Number  13    Number of Visits  16    Authorization Type  Medicaid- authorization request submitted 3/6 approved for 12 visits from 06/25/2018 to 08/05/2018    Authorization - Visit Number  16    Authorization - Number of Visits  16    PT Start Time  1015    PT Stop Time  1053    PT Time Calculation (min)  38 min    Activity Tolerance  Patient tolerated treatment well    Behavior During Therapy  [facility] for tasks assessed/performed       Past Medical History:  Diagnosis Date  . Ovarian cyst   . UTI (urinary tract infection)     Past Surgical History:  Procedure Laterality Date  . CHOLECYSTECTOMY      There were no vitals filed for this visit.  Subjective Assessment - 08/01/18 1043    Subjective  Pt relays my shoulder is doing alot better and have been working on it a lot at home    Patient Stated Goals  get arm back to working. reduce dizziness    Currently in Pain?  No/denies                       [facility] Adult PT Treatment/Exercise - 08/01/18 1044      Shoulder Exercises: Supine   Protraction  Left;20 reps    Protraction Weight (lbs)  4      Shoulder Exercises: Seated   Other Seated Exercises  elbow flexion 4# x 25 reps, elbow pronation and supination 4# x 25 reps      Shoulder Exercises: Standing   External Rotation  Left;20 reps    Theraband Level (Shoulder External Rotation)  Level 4 (Blue)    Internal Rotation  20 reps;Left    Theraband Level (Shoulder Internal Rotation)  Level 4 (Blue)    Flexion  Both;Strengthening;20 reps    Shoulder Flexion Weight (lbs)  1    ABduction   Both;Strengthening;15 reps    Shoulder ABduction Weight (lbs)  1    Extension  20 reps    Theraband Level (Shoulder Extension)  Level 4 (Blue)    Row  20 reps    Theraband Level (Shoulder Row)  Level 4 (Blue)    Other Standing Exercises  bent over row x20 5 pounds, lifting flexion to 2nd shelf in cabinet 3 lbs X 15, farmers carry 15 lbs in Lt  had 2 laps    Other Standing Exercises  ball rolls OH at wall with red weighted ball X 20 each way      Shoulder Exercises: Pulleys   Flexion  2 minutes    ABduction  2 minutes      Shoulder Exercises: ROM/Strengthening   UBE (Upper Arm Bike)  L3 2/2 min fwd/bck    Ranger  20 times flex and scaption, then 20 circles CW/CCW      Manual Therapy   Manual therapy comments  --   she declined Manual therapy today as her shoulder feels good              PT  Short Term Goals - 07/12/18 1000      PT SHORT TERM GOAL #1   Title  Pt will report dizziness improved by ay least 75%.    Baseline  now resolved    Time  3    Period  Weeks    Status  Achieved    Target Date  06/20/18      PT SHORT TERM GOAL #2   Title  improve AROM by at least 10 deg in flexion and abduction    Baseline  ROM WFL    Time  3    Period  Weeks    Status  Achieved      PT SHORT TERM GOAL #3   Title  pt will be able to make a full fist    Baseline  able to achieve and carry at least 3 lbs    Time  3    Period  Weeks    Status  Achieved        PT Long Term Goals - 07/19/18 1100      PT LONG TERM GOAL #2   Title  Pt will demonstrate functional AROM to achieve functional ranges to reach overhead for dressing and reaching to high cabinets    Baseline  weakness and pain  limits    Status  Achieved      PT LONG TERM GOAL #3   Title  she report s improved    Status  On-going      PT LONG TERM GOAL #4   Title  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 POC will be up next session and she will likely be ready for Discharge.     Stability/Clinical Decision Making  Unstable/Unpredictable    Rehab Potential  Good    PT Frequency  2x / Hawkins    PT Duration  8 weeks    PT Treatment/Interventions  Neuromuscular re-education;Canalith Repostioning;ADLs/Self Care Home Management;Cryotherapy;Electrical Stimulation;Moist Heat;Iontophoresis 4mg /ml Dexamethasone;Therapeutic activities;Therapeutic exercise;Patient/family education;Manual techniques;Passive range of motion;Taping;Dry needling    PT Next Visit Plan  likely discharge to HEP    PT Home Exercise Plan  Francee Piccolo- Daroff maneuver, putty grip, scap retraction, pendulum,  supine/ sitting cane AROM hand elbow. pulleys/table slides, blue band , carry at home.     Consulted and Agree with Plan of Care  Patient       Patient will benefit from skilled therapeutic intervention in order to improve the following deficits and impairments:     Visit Diagnosis: Closed displaced fracture of shaft of left clavicle with routine healing, subsequent encounter  Weakness of left arm  Pain in left arm  BPPV (benign paroxysmal positional vertigo), left     Problem List There are no active problems to display for this patient.   [name] 08/01/2018, 11:12 AM  [facility] [address] [place], [place], [address] Phone: [phone]   Fax:  [phone]  Name: Pamela Hawkins MRN: [id number] Date  of Birth: 02/21/1976

## 2018-08-02 ENCOUNTER — Encounter: Payer: Self-pay | Admitting: Physical Therapy

## 2018-08-03 ENCOUNTER — Ambulatory Visit: Payer: Medicaid Other | Admitting: Physical Therapy

## 2018-08-03 ENCOUNTER — Other Ambulatory Visit: Payer: Self-pay

## 2018-08-03 DIAGNOSIS — R29898 Other symptoms and signs involving the musculoskeletal system: Secondary | ICD-10-CM | POA: Diagnosis not present

## 2018-08-03 DIAGNOSIS — H8112 Benign paroxysmal vertigo, left ear: Secondary | ICD-10-CM

## 2018-08-03 DIAGNOSIS — S42022D Displaced fracture of shaft of left clavicle, subsequent encounter for fracture with routine healing: Secondary | ICD-10-CM | POA: Diagnosis not present

## 2018-08-03 DIAGNOSIS — M79602 Pain in left arm: Secondary | ICD-10-CM

## 2018-08-03 NOTE — Therapy (Signed)
[place], [place], [address] Phone: [phone]   Fax:  [phone]  Physical Therapy Treatment/Discharge PHYSICAL THERAPY DISCHARGE SUMMARY  Visits from Start of Care: 14  Current functional level related to goals / functional outcomes: See below, 95% back to normal function   Remaining deficits: Mild difficulty cleaning at times, now WNL strength and ROM in her shoulder   Education / Equipment: HEP  Plan: Patient agrees to discharge.  Patient goals were met. Patient is being discharged due to meeting the stated rehab goals.  ?????       Patient Details  Name: Pamela Hawkins Referring Provider (PT): Radene Journey, MD   Encounter Date: 08/03/2018  PT End of Session - 08/03/18 1217    Visit Number  14    Number of Visits  16    Authorization Type  Medicaid- authorization request submitted 3/6 approved for 12 visits from 06/25/2018 to 08/05/2018    Authorization - Visit Number  14   Authorization - Number of Visits  16    PT Start Time  1140    PT Stop Time  1218    PT Time Calculation (min)  38 min    Activity Tolerance  Patient tolerated treatment well    Behavior During Therapy  [facility] for tasks assessed/performed       Past Medical History:  Diagnosis Date  . Ovarian cyst   . UTI (urinary tract infection)     Past Surgical History:  Procedure Laterality Date  . CHOLECYSTECTOMY      There were no vitals filed for this visit.      [facility] PT Assessment - 08/03/18 0001      Assessment   Medical Diagnosis  Lt clavicle fracture    Referring Provider (PT)  Radene Journey, MD    Onset Date/Surgical Date  04/21/18    Hand Dominance  Right      AROM   Overall AROM Comments  Lt shoulder ROM WFL and now equal to Rt UE all planes      Strength   Overall Strength Comments  Lt shoulder and elbow strength 5/5 MMT all planes and WFL grip strength                    [facility] Adult PT Treatment/Exercise - 08/03/18 1215      Shoulder Exercises: Supine   Protraction  Left;20 reps    Protraction Weight (lbs)  4      Shoulder Exercises: Seated   Other Seated Exercises  elbow flexion 4# x 25 reps, elbow pronation and supination 4# x 25 reps      Shoulder Exercises: Standing   External Rotation  Left;20 reps    Theraband Level (Shoulder External Rotation)  Level 4 (Blue)    Internal Rotation  20 reps;Left    Theraband Level (Shoulder Internal Rotation)  Level 4 (Blue)    Flexion  Both;Strengthening;20 reps    Shoulder Flexion Weight (lbs)  2    ABduction  Both;Strengthening;15 reps    Shoulder ABduction Weight (lbs)  2    Extension  20 reps    Theraband Level (Shoulder Extension)  Level 4 (Blue)    Row  20 reps    Theraband Level (Shoulder Row)  Level 4 (Blue)    Other Standing Exercises  bent over row x20 5 pounds, lifting flexion to 2nd shelf in cabinet 3 lbs X 15, farmers  carry 15 lbs in Lt  had 2 laps    Other Standing Exercises  ball rolls OH at wall with red weighted ball X 20 each way      Shoulder Exercises: Pulleys   Flexion  2 minutes    ABduction  2 minutes      Shoulder Exercises: ROM/Strengthening   Ranger  20 times flex and scaption, then 20 circles CW/CCW      Shoulder Exercises: Stretch   Other Shoulder Stretches  doorway stretch 3X30 sec      Manual Therapy   Manual therapy comments  --   she declined Manual therapy today as her shoulder feels good            PT Education - 08/03/18 1217    Education Details  progress reviewed with results and rationale for discharge, HEP updated    Person(s) Educated  Patient    Methods  Explanation;Verbal cues;Handout;Demonstration    Comprehension  Verbalized understanding;Returned demonstration       PT Short Term Goals - 08/03/18 1207      PT SHORT TERM GOAL #1   Title  Pt will report dizziness improved by ay least 75%.    Baseline  now resolved     Time  3    Period  Weeks    Status  Achieved    Target Date  06/20/18      PT SHORT TERM GOAL #2   Title  improve AROM by at least 10 deg in flexion and abduction    Baseline  ROM WFL    Time  3    Period  Weeks    Status  Achieved      PT SHORT TERM GOAL #3   Title  pt will be able to make a full fist    Baseline  able to achieve and carry at least 3 lbs    Time  3    Period  Weeks    Status  Achieved        PT Long Term Goals - 08/03/18 1207      PT LONG TERM GOAL #1   Title  Pt will demonstrate negative Dix Hallpike testing to indicate resolution of BPPV and increase safety with mobility.     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 She has met all PT goals. She will be discharged due to progress and she feels confident with this decision. Her HEP was updated and finalized and she had no  further questions or concerns.     PT Frequency  --   discharge today   PT Treatment/Interventions  Neuromuscular re-education;Canalith Repostioning;ADLs/Self Care Home Management;Cryotherapy;Electrical Stimulation;Moist Heat;Iontophoresis 45m/ml Dexamethasone;Therapeutic activities;Therapeutic exercise;Patient/family education;Manual techniques;Passive range of motion;Taping;Dry needling    PT Next Visit Plan  discharge    PT Home Exercise Plan  see pt instructions for update    Consulted and Agree with Plan of Care  Patient       Patient will benefit from skilled therapeutic intervention in order to improve the following deficits and impairments:     Visit Diagnosis: Closed displaced fracture of shaft of left clavicle with routine healing, subsequent encounter  Weakness of left arm  Pain in left arm  BPPV (benign paroxysmal positional vertigo), left     Problem List There are no active problems to display for this patient.   BSilvestre Mesi4/24/2020, 12:21 PM  [facility]GR[place] [address]Phone: [phone]  Fax:  [phone] Name: Pamela CominsMRN: [id number] of Birth: 606-20-1977

## 2018-08-03 NOTE — Patient Instructions (Signed)
Access Code: [email or web address]  URL: [email or web address]  Date: 08/03/2018  Prepared by: Ivery Quale   Exercises  Doorway Pec Stretch at 90 Degrees Abduction - 3 reps - 1 sets - 30 hold - 2x daily - 6x weekly  Standing Shoulder Flexion Wall Slide - 10 reps - 3 sets - 2x daily - 6x weekly  Standing Shoulder Abduction Slides at Wall - 10 reps - 3 sets - 2x daily - 6x weekly  Standing Shoulder Row with Anchored Resistance - 10 reps - 3 sets - 2x daily - 6x weekly  Shoulder Extension with Resistance - Neutral - 10 reps - 3 sets - 2x daily - 6x weekly  Shoulder External Rotation with Anchored Resistance - 10 reps - 3 sets - 2x daily - 6x weekly  Shoulder Internal Rotation with Resistance - 10 reps - 3 sets - 2x daily - 6x weekly  Standing Shoulder Flexion to 90 Degrees with Dumbbells - 10 reps - 2 sets - 2x daily - 6x weekly  Shoulder Abduction with Dumbbells - Palms Down - 10 reps - 2 sets - 2x daily - 6x weekly  Shoulder Overhead Press in Flexion with Dumbbells - 10 reps - 2 sets - 2x daily - 6x weekly  Wall Push Up - 10 reps - 3 sets - 2x daily - 6x weekly

## 2018-08-07 ENCOUNTER — Encounter: Payer: Self-pay | Admitting: Physical Therapy

## 2018-08-09 ENCOUNTER — Encounter: Payer: Self-pay | Admitting: Physical Therapy

## 2018-08-14 ENCOUNTER — Encounter: Payer: Self-pay | Admitting: Physical Therapy

## 2018-08-16 ENCOUNTER — Encounter: Payer: Self-pay | Admitting: Physical Therapy

## 2018-12-03 ENCOUNTER — Other Ambulatory Visit: Payer: Self-pay

## 2018-12-03 ENCOUNTER — Emergency Department (HOSPITAL_COMMUNITY): Payer: Medicaid Other

## 2018-12-03 ENCOUNTER — Emergency Department (HOSPITAL_COMMUNITY)
Admission: EM | Admit: 2018-12-03 | Discharge: 2018-12-03 | Disposition: A | Discharge status: Home / Self Care | Payer: Medicaid Other | Attending: Emergency Medicine | Admitting: Emergency Medicine

## 2018-12-03 ENCOUNTER — Encounter (HOSPITAL_COMMUNITY): Payer: Self-pay | Admitting: Emergency Medicine

## 2018-12-03 DIAGNOSIS — M542 Cervicalgia: Secondary | ICD-10-CM | POA: Diagnosis not present

## 2018-12-03 DIAGNOSIS — M79622 Pain in left upper arm: Secondary | ICD-10-CM | POA: Insufficient documentation

## 2018-12-03 DIAGNOSIS — S70912A Unspecified superficial injury of left hip, initial encounter: Secondary | ICD-10-CM | POA: Diagnosis present

## 2018-12-03 DIAGNOSIS — Y93I9 Activity, other involving external motion: Secondary | ICD-10-CM | POA: Diagnosis not present

## 2018-12-03 DIAGNOSIS — S161XXA Strain of muscle, fascia and tendon at neck level, initial encounter: Secondary | ICD-10-CM | POA: Diagnosis not present

## 2018-12-03 DIAGNOSIS — R52 Pain, unspecified: Secondary | ICD-10-CM | POA: Diagnosis not present

## 2018-12-03 DIAGNOSIS — S139XXA Sprain of joints and ligaments of unspecified parts of neck, initial encounter: Secondary | ICD-10-CM | POA: Insufficient documentation

## 2018-12-03 DIAGNOSIS — Y999 Unspecified external cause status: Secondary | ICD-10-CM | POA: Diagnosis not present

## 2018-12-03 DIAGNOSIS — S199XXA Unspecified injury of neck, initial encounter: Secondary | ICD-10-CM | POA: Diagnosis not present

## 2018-12-03 DIAGNOSIS — Z79899 Other long term (current) drug therapy: Secondary | ICD-10-CM | POA: Insufficient documentation

## 2018-12-03 DIAGNOSIS — M545 Low back pain: Secondary | ICD-10-CM | POA: Diagnosis not present

## 2018-12-03 DIAGNOSIS — S299XXA Unspecified injury of thorax, initial encounter: Secondary | ICD-10-CM | POA: Diagnosis not present

## 2018-12-03 DIAGNOSIS — R609 Edema, unspecified: Secondary | ICD-10-CM | POA: Diagnosis not present

## 2018-12-03 DIAGNOSIS — Y92410 Unspecified street and highway as the place of occurrence of the external cause: Secondary | ICD-10-CM | POA: Diagnosis not present

## 2018-12-03 DIAGNOSIS — S7002XA Contusion of left hip, initial encounter: Secondary | ICD-10-CM | POA: Diagnosis not present

## 2018-12-03 DIAGNOSIS — S3992XA Unspecified injury of lower back, initial encounter: Secondary | ICD-10-CM | POA: Diagnosis not present

## 2018-12-03 DIAGNOSIS — I10 Essential (primary) hypertension: Secondary | ICD-10-CM | POA: Diagnosis not present

## 2018-12-03 DIAGNOSIS — R457 State of emotional shock and stress, unspecified: Secondary | ICD-10-CM | POA: Diagnosis not present

## 2018-12-03 DIAGNOSIS — M25552 Pain in left hip: Secondary | ICD-10-CM | POA: Diagnosis not present

## 2018-12-03 DIAGNOSIS — R000 Tachycardia, unspecified: Secondary | ICD-10-CM | POA: Diagnosis not present

## 2018-12-03 DIAGNOSIS — S79912A Unspecified injury of left hip, initial encounter: Secondary | ICD-10-CM | POA: Diagnosis not present

## 2018-12-03 MED ORDER — CYCLOBENZAPRINE HCL 5 MG PO TABS: 5.0000 mg | ORAL_TABLET | Freq: Two times a day (BID) | ORAL | 0 refills | Status: DC | PRN

## 2018-12-03 MED ORDER — HYDROCODONE-ACETAMINOPHEN 5-325 MG PO TABS: 1.0000 | ORAL_TABLET | Freq: Four times a day (QID) | ORAL | 0 refills | Status: DC | PRN

## 2018-12-03 MED ORDER — IBUPROFEN 800 MG PO TABS
800.0000 mg | ORAL_TABLET | Freq: Once | ORAL | Status: AC
  Administered 2018-12-03: 08:00:00 800 mg via ORAL
  Filled 2018-12-03: qty 1

## 2018-12-03 NOTE — ED Notes (Signed)
Patient verbalizes understanding of discharge instructions. Opportunity for questioning and answers were provided. Armband removed by staff, pt discharged from ED.  
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
## 2018-12-03 NOTE — ED Notes (Signed)
Pt ambulated well in the room no complaints noted at this time
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
## 2019-01-10 DIAGNOSIS — S42002A Fracture of unspecified part of left clavicle, initial encounter for closed fracture: Secondary | ICD-10-CM | POA: Diagnosis not present

## 2019-04-29 DIAGNOSIS — S42002D Fracture of unspecified part of left clavicle, subsequent encounter for fracture with routine healing: Secondary | ICD-10-CM | POA: Diagnosis not present

## 2019-04-29 DIAGNOSIS — M5412 Radiculopathy, cervical region: Secondary | ICD-10-CM | POA: Diagnosis not present

## 2019-05-26 DIAGNOSIS — Z20828 Contact with and (suspected) exposure to other viral communicable diseases: Secondary | ICD-10-CM | POA: Diagnosis not present

## 2019-06-26 DIAGNOSIS — S42011D Anterior displaced fracture of sternal end of right clavicle, subsequent encounter for fracture with routine healing: Secondary | ICD-10-CM | POA: Diagnosis not present

## 2019-07-15 DIAGNOSIS — M5412 Radiculopathy, cervical region: Secondary | ICD-10-CM | POA: Diagnosis not present

## 2019-07-15 DIAGNOSIS — Z6841 Body Mass Index (BMI) 40.0 and over, adult: Secondary | ICD-10-CM | POA: Diagnosis not present

## 2019-08-27 ENCOUNTER — Ambulatory Visit: Payer: Medicaid Other | Attending: Physical Medicine and Rehabilitation | Admitting: Physical Therapy
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
## 2019-10-29 DIAGNOSIS — Z20822 Contact with and (suspected) exposure to covid-19: Secondary | ICD-10-CM | POA: Diagnosis not present

## 2019-10-29 DIAGNOSIS — R519 Headache, unspecified: Secondary | ICD-10-CM | POA: Diagnosis not present

## 2019-10-29 DIAGNOSIS — R05 Cough: Secondary | ICD-10-CM | POA: Diagnosis not present

## 2019-10-29 DIAGNOSIS — J029 Acute pharyngitis, unspecified: Secondary | ICD-10-CM | POA: Diagnosis not present

## 2019-10-29 DIAGNOSIS — R509 Fever, unspecified: Secondary | ICD-10-CM | POA: Diagnosis not present

## 2019-10-29 DIAGNOSIS — R0981 Nasal congestion: Secondary | ICD-10-CM | POA: Diagnosis not present

## 2019-10-30 ENCOUNTER — Ambulatory Visit: Payer: Self-pay

## 2019-11-01 DIAGNOSIS — Z20822 Contact with and (suspected) exposure to covid-19: Secondary | ICD-10-CM | POA: Diagnosis not present

## 2019-11-01 DIAGNOSIS — J209 Acute bronchitis, unspecified: Secondary | ICD-10-CM | POA: Diagnosis not present

## 2019-11-01 DIAGNOSIS — J9801 Acute bronchospasm: Secondary | ICD-10-CM | POA: Diagnosis not present

## 2019-11-01 DIAGNOSIS — Z1159 Encounter for screening for other viral diseases: Secondary | ICD-10-CM | POA: Diagnosis not present

## 2019-11-01 DIAGNOSIS — R05 Cough: Secondary | ICD-10-CM | POA: Diagnosis not present

## 2019-11-18 DIAGNOSIS — H5213 Myopia, bilateral: Secondary | ICD-10-CM | POA: Diagnosis not present

## 2019-12-30 DIAGNOSIS — Z131 Encounter for screening for diabetes mellitus: Secondary | ICD-10-CM | POA: Diagnosis not present

## 2019-12-30 DIAGNOSIS — Z1231 Encounter for screening mammogram for malignant neoplasm of breast: Secondary | ICD-10-CM | POA: Diagnosis not present

## 2019-12-30 DIAGNOSIS — Z113 Encounter for screening for infections with a predominantly sexual mode of transmission: Secondary | ICD-10-CM | POA: Diagnosis not present

## 2019-12-30 DIAGNOSIS — Z13220 Encounter for screening for lipoid disorders: Secondary | ICD-10-CM | POA: Diagnosis not present

## 2019-12-30 DIAGNOSIS — Z6841 Body Mass Index (BMI) 40.0 and over, adult: Secondary | ICD-10-CM | POA: Diagnosis not present

## 2019-12-30 DIAGNOSIS — Z0000 Encounter for general adult medical examination without abnormal findings: Secondary | ICD-10-CM | POA: Diagnosis not present

## 2019-12-30 DIAGNOSIS — Z124 Encounter for screening for malignant neoplasm of cervix: Secondary | ICD-10-CM | POA: Diagnosis not present

## 2020-01-09 DIAGNOSIS — Z1231 Encounter for screening mammogram for malignant neoplasm of breast: Secondary | ICD-10-CM | POA: Diagnosis not present

## 2020-01-10 DIAGNOSIS — N6322 Unspecified lump in the left breast, upper inner quadrant: Secondary | ICD-10-CM | POA: Diagnosis not present

## 2020-01-10 DIAGNOSIS — R928 Other abnormal and inconclusive findings on diagnostic imaging of breast: Secondary | ICD-10-CM | POA: Diagnosis not present

## 2020-01-10 DIAGNOSIS — N6012 Diffuse cystic mastopathy of left breast: Secondary | ICD-10-CM | POA: Diagnosis not present

## 2020-01-23 DIAGNOSIS — N6002 Solitary cyst of left breast: Secondary | ICD-10-CM | POA: Diagnosis not present

## 2020-01-23 DIAGNOSIS — R928 Other abnormal and inconclusive findings on diagnostic imaging of breast: Secondary | ICD-10-CM | POA: Diagnosis not present

## 2020-02-10 DIAGNOSIS — Z20822 Contact with and (suspected) exposure to covid-19: Secondary | ICD-10-CM | POA: Diagnosis not present

## 2020-02-10 DIAGNOSIS — N390 Urinary tract infection, site not specified: Secondary | ICD-10-CM | POA: Diagnosis not present

## 2020-02-10 DIAGNOSIS — R1031 Right lower quadrant pain: Secondary | ICD-10-CM | POA: Diagnosis not present

## 2020-02-10 DIAGNOSIS — K449 Diaphragmatic hernia without obstruction or gangrene: Secondary | ICD-10-CM | POA: Diagnosis not present

## 2020-02-10 DIAGNOSIS — R1032 Left lower quadrant pain: Secondary | ICD-10-CM | POA: Diagnosis not present

## 2020-02-14 DIAGNOSIS — A499 Bacterial infection, unspecified: Secondary | ICD-10-CM | POA: Diagnosis not present

## 2020-02-14 DIAGNOSIS — N800 Endometriosis of uterus: Secondary | ICD-10-CM | POA: Diagnosis not present

## 2020-02-14 DIAGNOSIS — N390 Urinary tract infection, site not specified: Secondary | ICD-10-CM | POA: Diagnosis not present

## 2020-03-24 DIAGNOSIS — N920 Excessive and frequent menstruation with regular cycle: Secondary | ICD-10-CM | POA: Diagnosis not present

## 2020-03-24 DIAGNOSIS — R102 Pelvic and perineal pain: Secondary | ICD-10-CM | POA: Diagnosis not present

## 2020-03-24 DIAGNOSIS — N946 Dysmenorrhea, unspecified: Secondary | ICD-10-CM | POA: Diagnosis not present

## 2020-03-24 DIAGNOSIS — N921 Excessive and frequent menstruation with irregular cycle: Secondary | ICD-10-CM | POA: Diagnosis not present

## 2020-04-17 ENCOUNTER — Encounter (HOSPITAL_COMMUNITY): Payer: Self-pay | Admitting: Emergency Medicine

## 2020-05-14 DIAGNOSIS — M545 Low back pain, unspecified: Secondary | ICD-10-CM | POA: Diagnosis not present

## 2020-05-14 DIAGNOSIS — M542 Cervicalgia: Secondary | ICD-10-CM | POA: Diagnosis not present

## 2020-05-14 DIAGNOSIS — M47812 Spondylosis without myelopathy or radiculopathy, cervical region: Secondary | ICD-10-CM | POA: Diagnosis not present

## 2020-05-14 DIAGNOSIS — M47816 Spondylosis without myelopathy or radiculopathy, lumbar region: Secondary | ICD-10-CM | POA: Diagnosis not present

## 2020-05-27 DIAGNOSIS — R350 Frequency of micturition: Secondary | ICD-10-CM | POA: Diagnosis not present

## 2020-05-27 DIAGNOSIS — R300 Dysuria: Secondary | ICD-10-CM | POA: Diagnosis not present

## 2020-06-08 DIAGNOSIS — M4726 Other spondylosis with radiculopathy, lumbar region: Secondary | ICD-10-CM | POA: Diagnosis not present

## 2020-06-08 DIAGNOSIS — M4722 Other spondylosis with radiculopathy, cervical region: Secondary | ICD-10-CM | POA: Diagnosis not present

## 2020-06-08 DIAGNOSIS — M5116 Intervertebral disc disorders with radiculopathy, lumbar region: Secondary | ICD-10-CM | POA: Diagnosis not present

## 2020-06-08 DIAGNOSIS — M4802 Spinal stenosis, cervical region: Secondary | ICD-10-CM | POA: Diagnosis not present

## 2020-06-08 DIAGNOSIS — M47816 Spondylosis without myelopathy or radiculopathy, lumbar region: Secondary | ICD-10-CM | POA: Diagnosis not present

## 2020-06-08 DIAGNOSIS — M4727 Other spondylosis with radiculopathy, lumbosacral region: Secondary | ICD-10-CM | POA: Diagnosis not present

## 2020-06-08 DIAGNOSIS — M2578 Osteophyte, vertebrae: Secondary | ICD-10-CM | POA: Diagnosis not present

## 2020-06-08 DIAGNOSIS — M5117 Intervertebral disc disorders with radiculopathy, lumbosacral region: Secondary | ICD-10-CM | POA: Diagnosis not present

## 2020-06-08 DIAGNOSIS — M47812 Spondylosis without myelopathy or radiculopathy, cervical region: Secondary | ICD-10-CM | POA: Diagnosis not present

## 2020-06-23 DIAGNOSIS — N946 Dysmenorrhea, unspecified: Secondary | ICD-10-CM | POA: Diagnosis not present

## 2020-06-23 DIAGNOSIS — N920 Excessive and frequent menstruation with regular cycle: Secondary | ICD-10-CM | POA: Diagnosis not present

## 2020-06-23 DIAGNOSIS — R030 Elevated blood-pressure reading, without diagnosis of hypertension: Secondary | ICD-10-CM | POA: Diagnosis not present

## 2020-06-25 DIAGNOSIS — M5412 Radiculopathy, cervical region: Secondary | ICD-10-CM | POA: Diagnosis not present

## 2020-06-25 DIAGNOSIS — M5416 Radiculopathy, lumbar region: Secondary | ICD-10-CM | POA: Diagnosis not present

## 2020-06-25 DIAGNOSIS — M47812 Spondylosis without myelopathy or radiculopathy, cervical region: Secondary | ICD-10-CM | POA: Diagnosis not present

## 2020-07-13 DIAGNOSIS — R922 Inconclusive mammogram: Secondary | ICD-10-CM | POA: Diagnosis not present

## 2020-07-13 DIAGNOSIS — N6322 Unspecified lump in the left breast, upper inner quadrant: Secondary | ICD-10-CM | POA: Diagnosis not present

## 2020-07-16 DIAGNOSIS — M5416 Radiculopathy, lumbar region: Secondary | ICD-10-CM | POA: Diagnosis not present

## 2020-11-13 ENCOUNTER — Ambulatory Visit
Admission: RE | Admit: 2020-11-13 | Discharge: 2020-11-13 | Disposition: A | Discharge status: Home / Self Care | Payer: Medicaid Other | Source: Ambulatory Visit | Attending: Orthopedic Surgery | Admitting: Orthopedic Surgery

## 2020-11-13 ENCOUNTER — Other Ambulatory Visit: Payer: Self-pay | Admitting: Orthopedic Surgery

## 2020-11-13 DIAGNOSIS — M79604 Pain in right leg: Secondary | ICD-10-CM

## 2021-09-16 IMAGING — US US EXTREM LOW VENOUS*R*
1 series · 13 of 24 positions shown · non-contrast
Comparison: None.

CLINICAL DATA: Pain and swelling for 3 months



[Series 1: us extrem low venous*right* · 0.08mm/px · 13 of 32 slices shown]
[im 1/32]
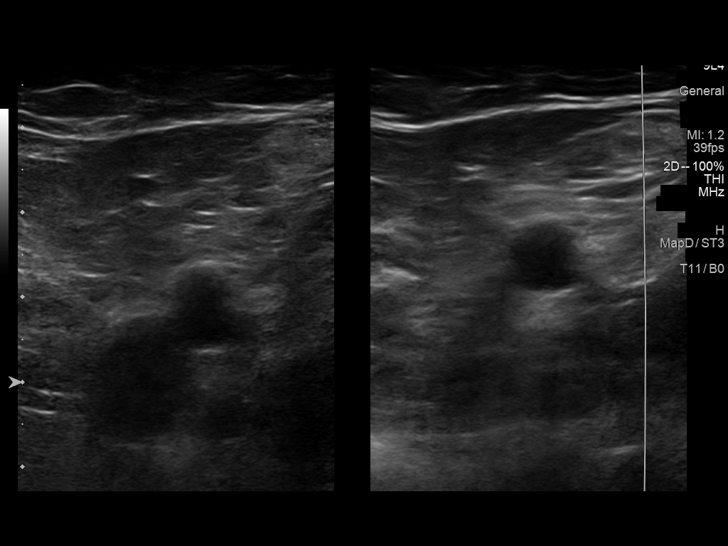
[im 3/32]
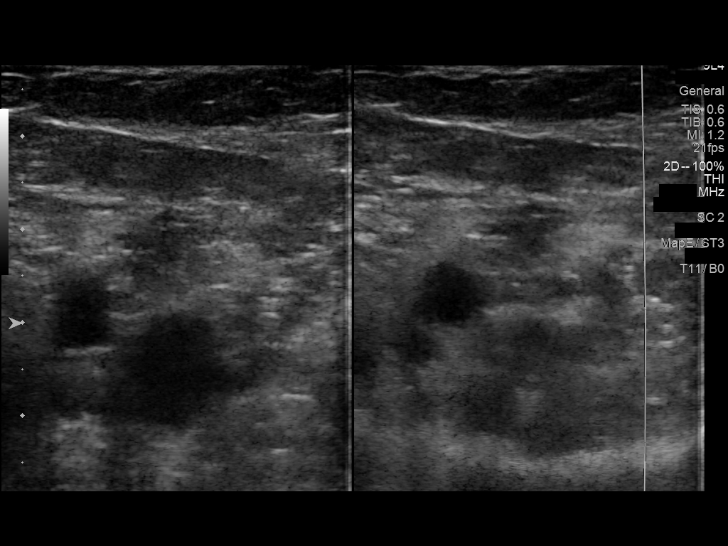
[im 6/32]
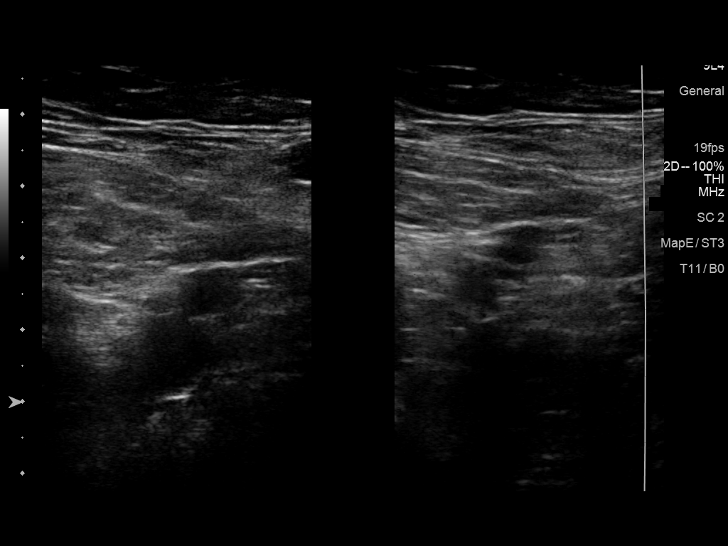
[im 9/32]
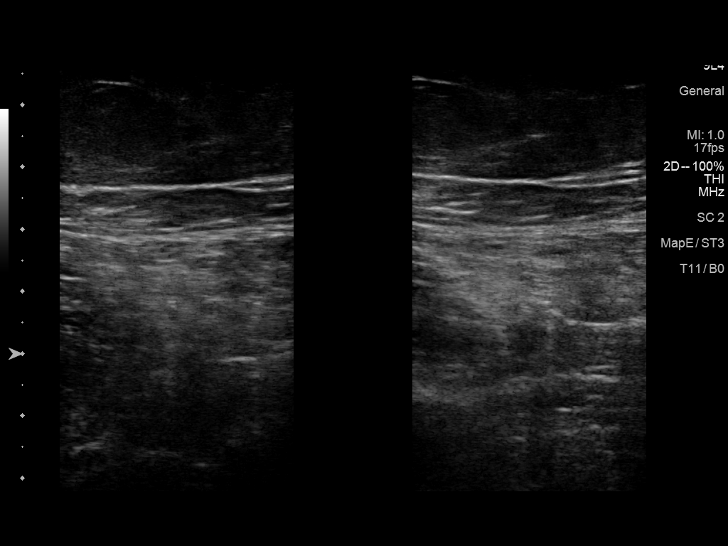
[im 11/32]
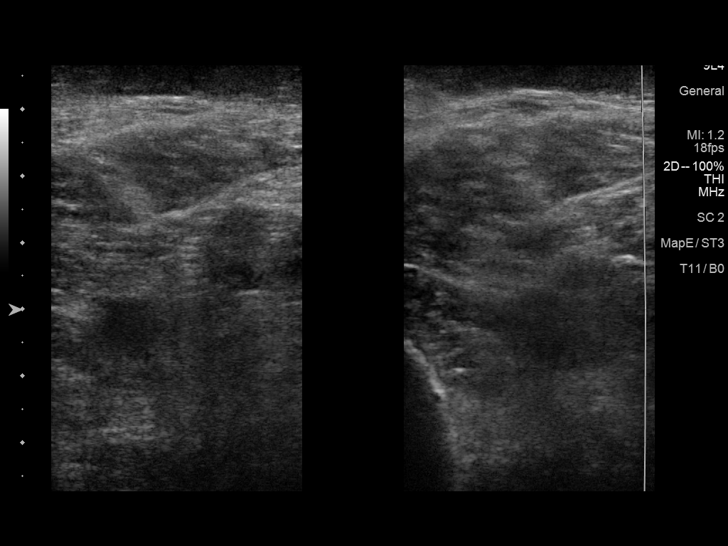
[im 14/32]
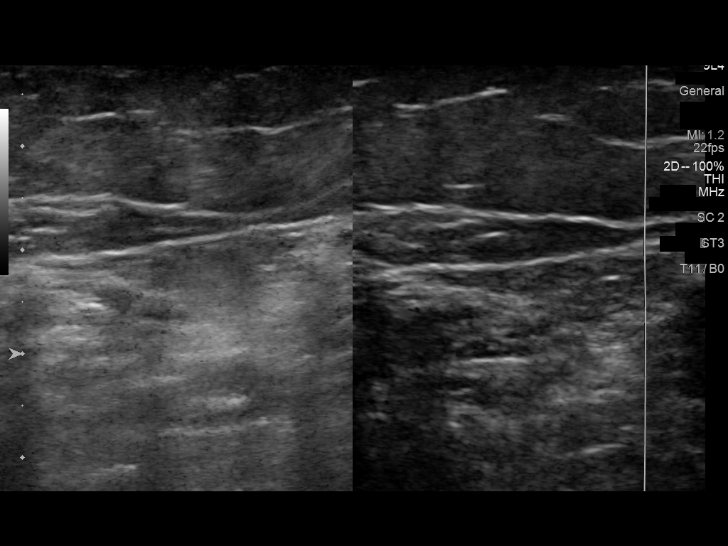
[im 17/32]
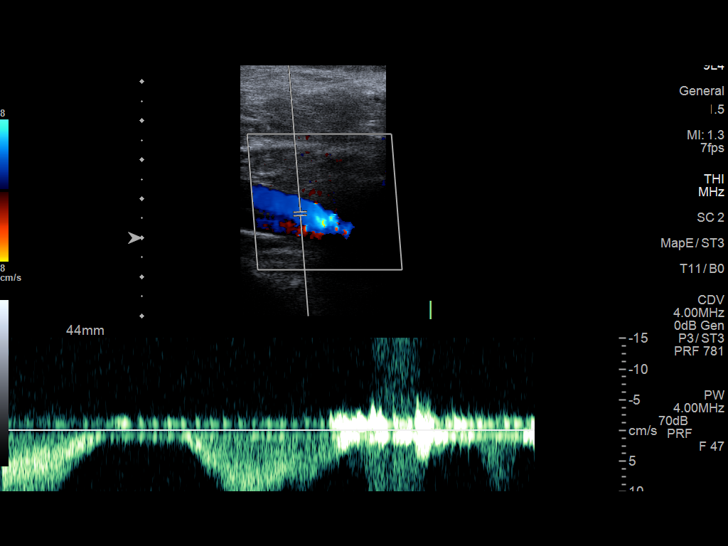
[im 18/32]
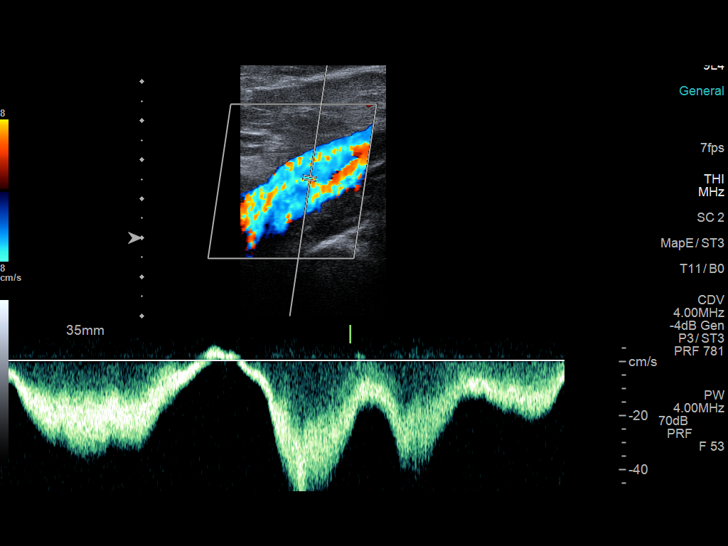
[im 21/32]
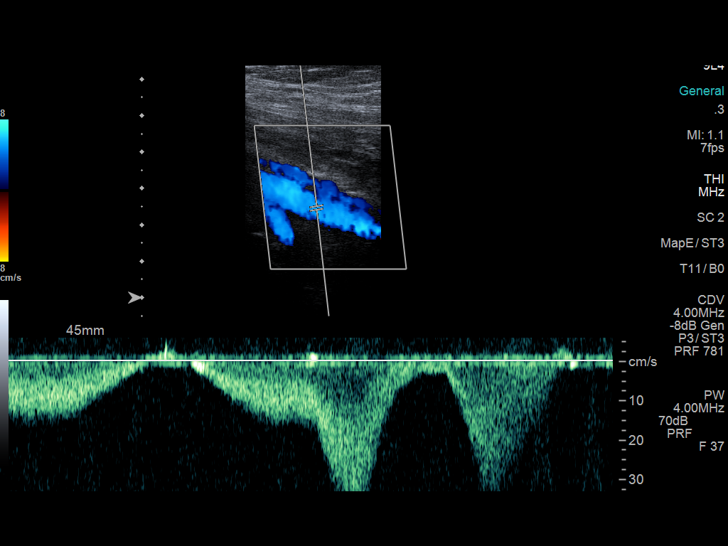
[im 23/32]
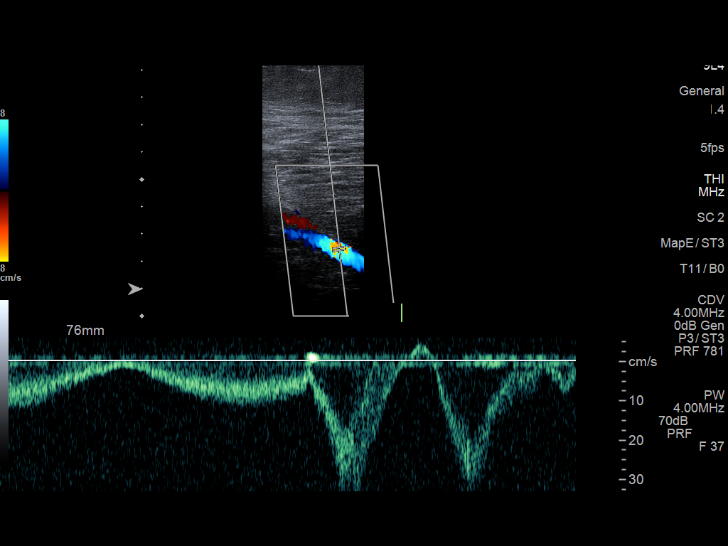
[im 26/32]
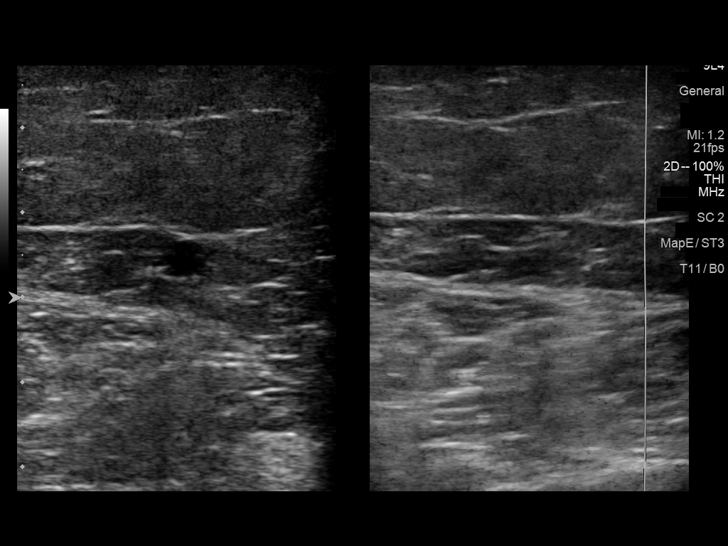
[im 29/32]
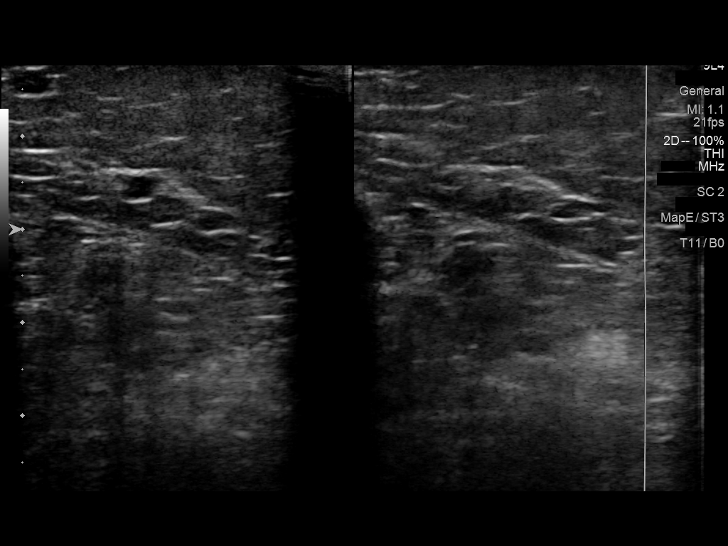
[im 32/32]
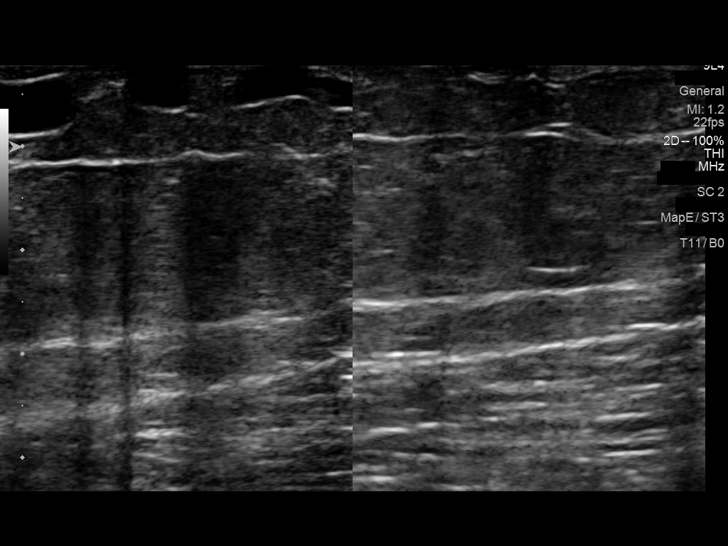

[13 of 24 positions shown; findings below may reference images not displayed]

FINDINGS: Contralateral Common Femoral Vein: Respiratory phasicity is normal
and symmetric with the symptomatic side. No evidence of thrombus.
Normal compressibility.

Common Femoral Vein: No evidence of thrombus. Normal
compressibility, respiratory phasicity and response to augmentation.

Saphenofemoral Junction: No evidence of thrombus. Normal
compressibility and flow on color Doppler imaging.

Profunda Femoral Vein: No evidence of thrombus. Normal
compressibility and flow on color Doppler imaging.

Femoral Vein: No evidence of thrombus. Normal compressibility,
respiratory phasicity and response to augmentation.

Popliteal Vein: No evidence of thrombus. Normal compressibility,
respiratory phasicity and response to augmentation.

Calf Veins: Limited assessment of the calf veins. Posterior tibial
veins appear patent. Peroneal vein not well visualized.

Superficial Great Saphenous Vein: No evidence of thrombus. Normal
compressibility.

Other Findings: Small sub surface compressible varicosities in the
right thigh region noted. No superficial thrombosis or
thrombophlebitis.
IMPRESSION: Negative for significant right lower extremity DVT. Limited
visualization of the calf veins.

## 2022-08-08 ENCOUNTER — Ambulatory Visit
Admission: RE | Admit: 2022-08-08 | Discharge: 2022-08-08 | Disposition: A | Discharge status: Home / Self Care | Payer: Medicaid Other | Source: Ambulatory Visit | Attending: Family Medicine | Admitting: Family Medicine

## 2022-08-08 VITALS — BP 119/88 | HR 107 | Temp 98.2°F | Resp 20

## 2022-08-08 DIAGNOSIS — N3000 Acute cystitis without hematuria: Secondary | ICD-10-CM

## 2022-08-08 LAB — POCT URINALYSIS DIP (MANUAL ENTRY)
Bilirubin, UA: NEGATIVE
Blood, UA: NEGATIVE
Glucose, UA: NEGATIVE mg/dL
Ketones, POC UA: NEGATIVE mg/dL
Nitrite, UA: NEGATIVE
Protein Ur, POC: NEGATIVE mg/dL
Spec Grav, UA: 1.02 (ref 1.010–1.025)
Urobilinogen, UA: 1 E.U./dL
pH, UA: 6 (ref 5.0–8.0)

## 2022-08-08 MED ORDER — CIPROFLOXACIN HCL 500 MG PO TABS: 500.0000 mg | ORAL_TABLET | Freq: Two times a day (BID) | ORAL | 0 refills | Status: AC

## 2022-08-08 MED ORDER — PHENAZOPYRIDINE HCL 200 MG PO TABS: 200.0000 mg | ORAL_TABLET | Freq: Three times a day (TID) | ORAL | 0 refills | Status: DC

## 2022-08-08 NOTE — ED Triage Notes (Signed)
Pt reports for about 3-4 days having dysuria. Hx recurrent UTIs. Denies blood in urine.

## 2022-08-08 NOTE — ED Provider Notes (Signed)
[facility]    CSN: [id number] Arrival date & time: 08/08/22  1047      History   Chief Complaint Chief Complaint  Patient presents with   Dysuria    HPI ERA PARR is a 47 y.o. female.   Patient is here for painful urination x 3-4 days.  Lower abdominal pain, some frequency, nausea.  No vomiting.  No fevers/chills.  No back pain.  She has a h/o frequent UTI's, and these are the same symptoms.        Past Medical History:  Diagnosis Date   Ovarian cyst    UTI (urinary tract infection)     There are no problems to display for this patient.   Past Surgical History:  Procedure Laterality Date   CHOLECYSTECTOMY      OB History   No obstetric history on file.      Home Medications    Prior to Admission medications   Medication Sig Start Date End Date Taking? Authorizing Provider  acetaminophen (TYLENOL) 500 MG tablet Take 1,000 mg by mouth every 6 (six) hours as needed for headache.    [provider]  cyclobenzaprine (FLEXERIL) 5 MG tablet Take 1 tablet (5 mg total) by mouth 2 (two) times daily as needed for muscle spasms. 12/03/18   Tilden Fossa, MD  HYDROcodone-acetaminophen (NORCO/VICODIN) 5-325 MG tablet Take 1 tablet by mouth every 6 (six) hours as needed. 12/03/18   Tilden Fossa, MD  ibuprofen (ADVIL) 200 MG tablet Take 200-800 mg by mouth every 6 (six) hours as needed for headache or mild pain.    [provider]    Family History Family History  Problem Relation Age of Onset   Diabetes Mother    COPD Mother    Diabetes Father     Social History Social History   Tobacco Use   Smoking status: Never   Smokeless tobacco: Never  Substance Use Topics   Alcohol use: No   Drug use: Never     Allergies   Erythromycin, Codeine, Lisinopril, and Penicillins   Review of Systems Review of Systems  Constitutional:  Negative for chills and fever.  HENT: Negative.    Respiratory: Negative.     Cardiovascular: Negative.   Gastrointestinal:  Positive for abdominal pain.  Genitourinary:  Positive for dysuria and frequency.  Musculoskeletal: Negative.      Physical Exam Triage Vital Signs ED Triage Vitals  Enc Vitals Group     BP 08/08/22 1055 119/88     Pulse Rate 08/08/22 1055 (!) 107     Resp 08/08/22 1055 20     Temp 08/08/22 1055 98.2 F (36.8 C)     Temp Source 08/08/22 1055 Oral     SpO2 08/08/22 1055 93 %     Weight --      Height --      Head Circumference --      Peak Flow --      Pain Score 08/08/22 1053 4     Pain Loc --      Pain Edu? --      Excl. in GC? --    No data found.  Updated Vital Signs BP 119/88 (BP Location: Left Arm)   Pulse (!) 107   Temp 98.2 F (36.8 C) (Oral)   Resp 20   SpO2 93%   Visual Acuity Right Eye Distance:   Left Eye Distance:   Bilateral Distance:    Right Eye Near:  Left Eye Near:    Bilateral Near:     Physical Exam Constitutional:      Appearance: Normal appearance.  Cardiovascular:     Rate and Rhythm: Normal rate and regular rhythm.  Pulmonary:     Effort: Pulmonary effort is normal.     Breath sounds: Normal breath sounds.  Abdominal:     Palpations: Abdomen is soft.     Tenderness: There is abdominal tenderness. There is no right CVA tenderness, left CVA tenderness, guarding or rebound.  Skin:    General: Skin is warm.  Neurological:     General: No focal deficit present.     Mental Status: She is alert.  Psychiatric:        Mood and Affect: Mood normal.      UC Treatments / Results  Labs (all labs ordered are listed, but only abnormal results are displayed) Labs Reviewed  POCT URINALYSIS DIP (MANUAL ENTRY) - Abnormal; Notable for the following components:      Result Value   Clarity, UA hazy (*)    Leukocytes, UA Trace (*)    All other components within normal limits    EKG   Radiology No results found.  Procedures Procedures (including critical care time)  Medications  Ordered in UC Medications - No data to display  Initial Impression / Assessment and Plan / UC Course  I have reviewed the triage vital signs and the nursing notes.  Pertinent labs & imaging results that were available during my care of the patient were reviewed by me and considered in my medical decision making (see chart for details).  Final Clinical Impressions(s) / UC Diagnoses   Final diagnoses:  Acute cystitis without hematuria     Discharge Instructions      You were seen today for a UTI.  I have sent out cipro and pyridium to your pharmacy.  Please increase fluid intake as well.  Follow up here or with your primary care provider if not improving as expected.     ED Prescriptions     Medication Sig Dispense Auth. Provider   ciprofloxacin (CIPRO) 500 MG tablet Take 1 tablet (500 mg total) by mouth every 12 (twelve) hours for 5 days. 10 tablet Thomes Burak, MD   phenazopyridine (PYRIDIUM) 200 MG tablet Take 1 tablet (200 mg total) by mouth 3 (three) times daily. 6 tablet Jannifer Franklin, MD      PDMP not reviewed this encounter.   Jannifer Franklin, MD 08/08/22 1110

## 2022-08-08 NOTE — Discharge Instructions (Signed)
You were seen today for a UTI.  I have sent out cipro and pyridium to your pharmacy.  Please increase fluid intake as well.  Follow up here or with your primary care provider if not improving as expected.

## 2022-08-09 LAB — URINE CULTURE: Culture: 80000 — AB

## 2022-08-10 LAB — URINE CULTURE

## 2022-12-01 ENCOUNTER — Other Ambulatory Visit: Payer: Self-pay

## 2022-12-01 ENCOUNTER — Encounter (HOSPITAL_BASED_OUTPATIENT_CLINIC_OR_DEPARTMENT_OTHER): Payer: Self-pay | Admitting: *Deleted

## 2022-12-01 ENCOUNTER — Emergency Department (HOSPITAL_BASED_OUTPATIENT_CLINIC_OR_DEPARTMENT_OTHER)
Admission: EM | Admit: 2022-12-01 | Discharge: 2022-12-01 | Disposition: A | Discharge status: Home / Self Care | Payer: Medicaid Other | Attending: Emergency Medicine | Admitting: Emergency Medicine

## 2022-12-01 DIAGNOSIS — N939 Abnormal uterine and vaginal bleeding, unspecified: Secondary | ICD-10-CM | POA: Diagnosis present

## 2022-12-01 LAB — CBC
HCT: 41.1 % (ref 36.0–46.0)
Hemoglobin: 13.6 g/dL (ref 12.0–15.0)
MCH: 30.9 pg (ref 26.0–34.0)
MCHC: 33.1 g/dL (ref 30.0–36.0)
MCV: 93.4 fL (ref 80.0–100.0)
Platelets: 395 10*3/uL (ref 150–400)
RBC: 4.4 MIL/uL (ref 3.87–5.11)
RDW: 12.8 % (ref 11.5–15.5)
WBC: 11.4 10*3/uL — ABNORMAL HIGH (ref 4.0–10.5)
nRBC: 0 % (ref 0.0–0.2)

## 2022-12-01 LAB — HCG, SERUM, QUALITATIVE: Preg, Serum: NEGATIVE

## 2022-12-01 MED ORDER — TRANEXAMIC ACID 650 MG PO TABS (ORTHO): 1300.0000 mg | ORAL_TABLET | Freq: Three times a day (TID) | ORAL | 0 refills | Status: AC

## 2022-12-01 NOTE — ED Notes (Signed)
Urine sample saved

## 2022-12-01 NOTE — ED Provider Notes (Signed)
[facility] Provider Note   CSN: [id number] Arrival date & time: 12/01/22  5643     History  Chief Complaint  Patient presents with   Vaginal Bleeding    Pamela Hawkins is a 47 y.o. female.   Vaginal Bleeding    Patient presents emergency room plaints of vaginal bleeding.  Patient states that she had the onset of her menses earlier this month on August 6.  She was subsequently with an orthopedist for a steroid injection.  Patient states ever since then she has had persistent vaginal bleeding.  She is having right leg bleeding and occasionally passing clots.  She does use multiple pads.  Patient states she is feeling lightheaded.  She is not having any severe pain.  No fevers.  She has had some pelvic pain.  Patient states she called her OB/GYN doctor who suggested she come to the emergency room first.  They plan on seeing her in follow-up.  Patient states she has had similar episodes in the past.  She received an injection.  Home Medications Prior to Admission medications   Medication Sig Start Date End Date Taking? Authorizing Provider  acetaminophen (TYLENOL) 500 MG tablet Take 1,000 mg by mouth every 6 (six) hours as needed for headache.    [provider]  cyclobenzaprine (FLEXERIL) 5 MG tablet Take 1 tablet (5 mg total) by mouth 2 (two) times daily as needed for muscle spasms. 12/03/18   Tilden Fossa, MD  HYDROcodone-acetaminophen (NORCO/VICODIN) 5-325 MG tablet Take 1 tablet by mouth every 6 (six) hours as needed. 12/03/18   Tilden Fossa, MD  ibuprofen (ADVIL) 200 MG tablet Take 200-800 mg by mouth every 6 (six) hours as needed for headache or mild pain.    [provider]  phenazopyridine (PYRIDIUM) 200 MG tablet Take 1 tablet (200 mg total) by mouth 3 (three) times daily. 08/08/22   Piontek, Denny Peon, MD  tranexamic acid (LYSTEDA) 650 mg TABS tablet Take 2 tablets (1,300 mg total) by mouth 3 (three) times  daily for 5 days. 12/01/22 12/06/22 Yes Linwood Dibbles, MD      Allergies    Erythromycin, Codeine, Lisinopril, and Penicillins    Review of Systems   Review of Systems  Genitourinary:  Positive for vaginal bleeding.    Physical Exam Updated Vital Signs BP (!) 124/91   Pulse (!) 101   Temp 98.4 F (36.9 C) (Oral)   Resp 20   Ht 1.575 m (5\' 2" )   Wt 67.1 kg   LMP 11/15/2022 (Exact Date)   SpO2 95%   BMI 27.07 kg/m  Physical Exam Vitals and nursing note reviewed.  Constitutional:      Appearance: She is well-developed. She is not diaphoretic.  HENT:     Head: Normocephalic and atraumatic.     Right Ear: External ear normal.     Left Ear: External ear normal.  Eyes:     General: No scleral icterus.       Right eye: No discharge.        Left eye: No discharge.     Conjunctiva/sclera: Conjunctivae normal.  Neck:     Trachea: No tracheal deviation.  Cardiovascular:     Rate and Rhythm: Normal rate and regular rhythm.  Pulmonary:     Effort: Pulmonary effort is normal. No respiratory distress.     Breath sounds: Normal breath sounds. No stridor. No wheezing or rales.  Abdominal:     General: Bowel  sounds are normal. There is no distension.     Palpations: Abdomen is soft.     Tenderness: There is no abdominal tenderness. There is no guarding or rebound.  Genitourinary:    Pubic Area: No rash.      Labia:        Right: No lesion or injury.        Left: No lesion or injury.      Cervix: Cervical bleeding present.     Adnexa:        Right: No mass, tenderness or fullness.         Left: No mass, tenderness or fullness.       Comments: No clots or heavy bleeding noted on exam, small amount of blood in vaginal vault Musculoskeletal:        General: No tenderness or deformity.     Cervical back: Neck supple.  Skin:    General: Skin is warm and dry.     Findings: No rash.  Neurological:     General: No focal deficit present.     Mental Status: She is alert.     Cranial  Nerves: No cranial nerve deficit, dysarthria or facial asymmetry.     Sensory: No sensory deficit.     Motor: No abnormal muscle tone or seizure activity.     Coordination: Coordination normal.  Psychiatric:        Mood and Affect: Mood normal.     ED Results / Procedures / Treatments   Labs (all labs ordered are listed, but only abnormal results are displayed) Labs Reviewed  CBC - Abnormal; Notable for the following components:      Result Value   WBC 11.4 (*)    All other components within normal limits  HCG, SERUM, QUALITATIVE  GC/CHLAMYDIA PROBE AMP ([place]) NOT AT [facility]    EKG None  Radiology No results found.  Procedures Procedures    Medications Ordered in ED Medications - No data to display  ED Course/ Medical Decision Making/ A&P Clinical Course as of 12/01/22 1003  Thu Dec 01, 2022  0929 CBC(!) nl [JK]  1610 Reviewed prior records.  Previously was given TXA 650 mg, 1300mg  tid for 5 days [JK]  0957 Pregnancy test negative [JK]    Clinical Course User Index [JK] Linwood Dibbles, MD                                 Medical Decision Making Problems Addressed: Abnormal uterine bleeding (AUB): acute illness or injury that poses a threat to life or bodily functions  Amount and/or Complexity of Data Reviewed Labs: ordered. Decision-making details documented in ED Course.  Risk Prescription drug management.   Patient presented to the ED with complaints of vaginal bleeding lightheadedness.  Patient has had similar history of this in the past.  She has been prescribed TXA which was helpful.  Concerned about the possibility of anemia with her feeling lightheaded.  Fortunately no signs of heavy vaginal bleeding on exam and her hemoglobin is normal.  Patient is not pregnant.  Will discharge home with prescription for TXA as it was beneficial for her in the past.  Patient has plans to follow-up with her OB/GYN        Final Clinical Impression(s) / ED  Diagnoses Final diagnoses:  Abnormal uterine bleeding (AUB)    Rx / DC Orders ED Discharge Orders  Ordered    tranexamic acid (LYSTEDA) 650 mg TABS tablet  3 times daily        12/01/22 1001              Linwood Dibbles, MD 12/01/22 1003

## 2022-12-01 NOTE — ED Notes (Signed)
EDP at [facility] 

## 2022-12-01 NOTE — ED Triage Notes (Addendum)
Here by POV from home for vaginal bleeding with associated tiredness, weakness, intermittent dizziness, hematuria, LLQ cramping. Denies HA, other pain, sob, syncope, fever. Describes as bright red, with clots. 24 pads in last 24 hours, 4 pads in last hour. Last BM this am. No blood noted in stool. Last ate this am. Onset with LMP 8/6. H/o similar 1 year ago, received injection for same. Alert, NAD, calm, interactive, steady gait. GYN in [place].

## 2022-12-01 NOTE — Discharge Instructions (Signed)
The blood test does not show any signs of anemia.  Take the TXA prescription to help with your vaginal bleeding.  Follow-up with your OB/GYN doctor to be rechecked

## 2022-12-01 NOTE — ED Notes (Signed)
Up to b/r, steady gait 

## 2022-12-02 LAB — GC/CHLAMYDIA PROBE AMP (~~LOC~~) NOT AT ARMC
Chlamydia: NEGATIVE
Comment: NEGATIVE
Comment: NORMAL
Neisseria Gonorrhea: NEGATIVE

## 2022-12-12 ENCOUNTER — Ambulatory Visit: Payer: Self-pay

## 2023-01-03 ENCOUNTER — Other Ambulatory Visit (INDEPENDENT_AMBULATORY_CARE_PROVIDER_SITE_OTHER): Payer: Self-pay

## 2023-01-03 ENCOUNTER — Ambulatory Visit (INDEPENDENT_AMBULATORY_CARE_PROVIDER_SITE_OTHER): Payer: Medicaid Other | Admitting: Physician Assistant

## 2023-01-03 ENCOUNTER — Encounter: Payer: Self-pay | Admitting: Physician Assistant

## 2023-01-03 DIAGNOSIS — M25561 Pain in right knee: Secondary | ICD-10-CM

## 2023-01-03 DIAGNOSIS — M25512 Pain in left shoulder: Secondary | ICD-10-CM | POA: Diagnosis not present

## 2023-01-03 DIAGNOSIS — G8929 Other chronic pain: Secondary | ICD-10-CM

## 2023-01-03 DIAGNOSIS — M545 Low back pain, unspecified: Secondary | ICD-10-CM

## 2023-01-03 DIAGNOSIS — M25562 Pain in left knee: Secondary | ICD-10-CM

## 2023-01-03 MED ORDER — METHYLPREDNISOLONE 4 MG PO TBPK: ORAL_TABLET | ORAL | 0 refills | Status: AC

## 2023-01-03 NOTE — Addendum Note (Signed)
Addended by: Polly Cobia on: 01/03/2023 10:57 AM   Modules accepted: Orders
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
## 2023-01-11 ENCOUNTER — Telehealth: Payer: Self-pay | Admitting: Orthopedic Surgery

## 2023-01-11 NOTE — Telephone Encounter (Signed)
Can you call this patient and see if she can see Ellin Goodie? This is a new neck and back patient and would be appropriate for Megan to see. Thank you!!

## 2023-01-16 ENCOUNTER — Encounter: Payer: Self-pay | Admitting: Physical Medicine and Rehabilitation

## 2023-01-16 ENCOUNTER — Ambulatory Visit (INDEPENDENT_AMBULATORY_CARE_PROVIDER_SITE_OTHER): Payer: Medicaid Other | Admitting: Physical Medicine and Rehabilitation

## 2023-01-16 DIAGNOSIS — G894 Chronic pain syndrome: Secondary | ICD-10-CM | POA: Diagnosis not present

## 2023-01-16 DIAGNOSIS — M5416 Radiculopathy, lumbar region: Secondary | ICD-10-CM | POA: Diagnosis not present

## 2023-01-16 DIAGNOSIS — M7918 Myalgia, other site: Secondary | ICD-10-CM

## 2023-01-16 DIAGNOSIS — M5412 Radiculopathy, cervical region: Secondary | ICD-10-CM

## 2023-01-16 NOTE — Progress Notes (Unsigned)
Functional Pain Scale - descriptive words and definitions  Unmanageable (7)  Pain interferes with normal ADL's/nothing seems to help/sleep is very difficult/active distractions are very difficult to concentrate on. Severe range order  Average Pain 6-7, but can be worse at times  Neck pain with radiation in both shoulders. Back pain from neck to lower back
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
## 2023-01-17 ENCOUNTER — Ambulatory Visit: Payer: Medicaid Other | Admitting: Physical Therapy

## 2023-01-19 ENCOUNTER — Ambulatory Visit: Payer: Medicaid Other | Admitting: Orthopedic Surgery

## 2023-02-01 ENCOUNTER — Telehealth: Payer: Self-pay | Admitting: Physical Medicine and Rehabilitation

## 2023-02-01 NOTE — Telephone Encounter (Signed)
Re-faxed.

## 2023-02-01 NOTE — Telephone Encounter (Signed)
Pt called requesting a call back. Pt states [facility] states they never received referral. Please re fax referral to fax number [phone].

## 2023-02-23 ENCOUNTER — Ambulatory Visit: Payer: Medicaid Other | Admitting: Family Medicine
